# Patient Record
Sex: Male | Born: 1965 | Race: White | Hispanic: Yes | Marital: Single | Smoking: Never smoker
Health system: Southern US, Community
[De-identification: ages and names within clinical notes are randomized; demographics above are authoritative.]

## PROBLEM LIST (undated history)

## (undated) DIAGNOSIS — G4730 Sleep apnea, unspecified: Secondary | ICD-10-CM

## (undated) HISTORY — DX: Sleep apnea, unspecified: G47.30

## (undated) HISTORY — PX: CATARACT EXTRACTION: SUR2

---

## 2010-11-17 ENCOUNTER — Emergency Department (HOSPITAL_COMMUNITY)
Admission: EM | Admit: 2010-11-17 | Discharge: 2010-11-17 | Disposition: A | Discharge status: Home / Self Care | Payer: Self-pay | Attending: Emergency Medicine | Admitting: Emergency Medicine

## 2010-11-17 DIAGNOSIS — IMO0002 Reserved for concepts with insufficient information to code with codable children: Secondary | ICD-10-CM | POA: Insufficient documentation

## 2010-11-17 DIAGNOSIS — H168 Other keratitis: Secondary | ICD-10-CM | POA: Insufficient documentation

## 2010-11-17 DIAGNOSIS — T2006XA Burn of unspecified degree of forehead and cheek, initial encounter: Secondary | ICD-10-CM | POA: Insufficient documentation

## 2010-11-17 DIAGNOSIS — T2650XA Corrosion of unspecified eyelid and periocular area, initial encounter: Secondary | ICD-10-CM | POA: Insufficient documentation

## 2017-02-09 ENCOUNTER — Encounter (HOSPITAL_COMMUNITY): Payer: Self-pay

## 2017-02-09 ENCOUNTER — Emergency Department (HOSPITAL_COMMUNITY): Payer: Self-pay

## 2017-02-09 ENCOUNTER — Emergency Department (HOSPITAL_COMMUNITY)
Admission: EM | Admit: 2017-02-09 | Discharge: 2017-02-09 | Disposition: A | Discharge status: Home / Self Care | Payer: Self-pay | Attending: Emergency Medicine | Admitting: Emergency Medicine

## 2017-02-09 DIAGNOSIS — Z5321 Procedure and treatment not carried out due to patient leaving prior to being seen by health care provider: Secondary | ICD-10-CM | POA: Insufficient documentation

## 2017-02-09 DIAGNOSIS — R1011 Right upper quadrant pain: Secondary | ICD-10-CM | POA: Insufficient documentation

## 2017-02-09 DIAGNOSIS — R1010 Upper abdominal pain, unspecified: Secondary | ICD-10-CM

## 2017-02-09 DIAGNOSIS — R1031 Right lower quadrant pain: Secondary | ICD-10-CM | POA: Insufficient documentation

## 2017-02-09 LAB — COMPREHENSIVE METABOLIC PANEL
ALT: 36 U/L (ref 17–63)
AST: 21 U/L (ref 15–41)
Albumin: 3.5 g/dL (ref 3.5–5.0)
Alkaline Phosphatase: 62 U/L (ref 38–126)
Anion gap: 11 (ref 5–15)
BILIRUBIN TOTAL: 1.2 mg/dL (ref 0.3–1.2)
BUN: 14 mg/dL (ref 6–20)
CHLORIDE: 100 mmol/L — AB (ref 101–111)
CO2: 27 mmol/L (ref 22–32)
CREATININE: 0.78 mg/dL (ref 0.61–1.24)
Calcium: 9.2 mg/dL (ref 8.9–10.3)
GFR calc Af Amer: >60 mL/min (ref >60)
Glucose, Bld: 143 mg/dL — ABNORMAL HIGH (ref 65–99)
Potassium: 3.5 mmol/L (ref 3.5–5.1)
Sodium: 138 mmol/L (ref 135–145)
Total Protein: 7.4 g/dL (ref 6.5–8.1)

## 2017-02-09 LAB — URINALYSIS, ROUTINE W REFLEX MICROSCOPIC
BILIRUBIN URINE: NEGATIVE
GLUCOSE, UA: NEGATIVE mg/dL
HGB URINE DIPSTICK: NEGATIVE
KETONES UR: NEGATIVE mg/dL
LEUKOCYTES UA: NEGATIVE
Nitrite: NEGATIVE
PROTEIN: NEGATIVE mg/dL
Specific Gravity, Urine: 1.018 (ref 1.005–1.030)
pH: 5 (ref 5.0–8.0)

## 2017-02-09 LAB — CBC
HCT: 43.7 % (ref 39.0–52.0)
Hemoglobin: 14.9 g/dL (ref 13.0–17.0)
MCH: 30.8 pg (ref 26.0–34.0)
MCHC: 34.1 g/dL (ref 30.0–36.0)
MCV: 90.3 fL (ref 78.0–100.0)
PLATELETS: 248 10*3/uL (ref 150–400)
RBC: 4.84 MIL/uL (ref 4.22–5.81)
RDW: 12.5 % (ref 11.5–15.5)
WBC: 8.5 10*3/uL (ref 4.0–10.5)

## 2017-02-09 LAB — LIPASE, BLOOD: LIPASE: 31 U/L (ref 11–51)

## 2017-02-09 MED ORDER — PANTOPRAZOLE SODIUM 40 MG IV SOLR
40.0000 mg | Freq: Once | INTRAVENOUS | Status: AC
  Administered 2017-02-09: 40 mg via INTRAVENOUS
  Filled 2017-02-09: qty 40

## 2017-02-09 MED ORDER — TRAMADOL HCL 50 MG PO TABS: 50.0000 mg | ORAL_TABLET | Freq: Four times a day (QID) | ORAL | 0 refills | Status: DC | PRN

## 2017-02-09 MED ORDER — SODIUM CHLORIDE 0.9 % IV BOLUS (SEPSIS)
500.0000 mL | Freq: Once | INTRAVENOUS | Status: AC
  Administered 2017-02-09: 500 mL via INTRAVENOUS

## 2017-02-09 MED ORDER — PROMETHAZINE HCL 25 MG PO TABS: 25.0000 mg | ORAL_TABLET | Freq: Four times a day (QID) | ORAL | 0 refills | Status: DC | PRN

## 2017-02-09 MED ORDER — RANITIDINE HCL 150 MG PO TABS: 150.0000 mg | ORAL_TABLET | Freq: Two times a day (BID) | ORAL | 0 refills | Status: DC

## 2017-02-09 NOTE — Discharge Instructions (Signed)
Follow-up with the stomach doctors at [facility].   Return if problems.

## 2017-02-09 NOTE — ED Provider Notes (Signed)
[facility]EMER[facility]ENCY DEPT Provider Note   CSN: [id number] Arrival date & time: 02/09/17  7829     History   Chief Complaint Chief Complaint  Patient presents with  . Abdominal Pain    HPI Darryl Hull is a 51 y.o. male.  Patient complains of abdominal pain. He's been having periodic right upper quadrant abdominal pain. He came to the emergency room early this morning had lab work done but was not seen and went home. The pain returned.   The history is provided by the patient.  Abdominal Pain   This is a new problem. The current episode started 12 to 24 hours ago. The problem occurs constantly. The problem has not changed since onset.The pain is associated with an unknown factor. The pain is located in the RUQ. The pain is at a severity of 4/10. The pain is moderate. Pertinent negatives include anorexia, diarrhea, frequency, hematuria and headaches. Nothing aggravates the symptoms. Nothing relieves the symptoms. Past workup does not include GI consult. His past medical history does not include PUD.    History reviewed. No pertinent past medical history.  There are no active problems to display for this patient.   History reviewed. No pertinent surgical history.     Home Medications    Prior to Admission medications   Medication Sig Start Date End Date Taking? Authorizing Provider  promethazine (PHENERGAN) 25 MG tablet Take 1 tablet (25 mg total) by mouth every 6 (six) hours as needed for nausea or vomiting. 02/09/17   Bethann Berkshire, MD  ranitidine (ZANTAC) 150 MG tablet Take 1 tablet (150 mg total) by mouth 2 (two) times daily. 02/09/17   Bethann Berkshire, MD  traMADol (ULTRAM) 50 MG tablet Take 1 tablet (50 mg total) by mouth every 6 (six) hours as needed. 02/09/17   Bethann Berkshire, MD    Family History No family history on file.  Social History Social History  Substance Use Topics  . Smoking status: Never Smoker  . Smokeless tobacco: Never Used  . Alcohol use Yes   Comment: occasionally      Allergies   Patient has no known allergies.   Review of Systems Review of Systems  Constitutional: Negative for appetite change and fatigue.  HENT: Negative for congestion, ear discharge and sinus pressure.   Eyes: Negative for discharge.  Respiratory: Negative for cough.   Cardiovascular: Negative for chest pain.  Gastrointestinal: Positive for abdominal pain. Negative for anorexia and diarrhea.  Genitourinary: Negative for frequency and hematuria.  Musculoskeletal: Negative for back pain.  Skin: Negative for rash.  Neurological: Negative for seizures and headaches.  Psychiatric/Behavioral: Negative for hallucinations.     Physical Exam Updated Vital Signs BP (!) 143/92   Pulse 67   Temp 97.8 F (36.6 C) (Oral)   Resp 18   Ht 6\' 2"  (1.88 m)   Wt 103 kg (227 lb)   SpO2 99%   BMI 29.15 kg/m   Physical Exam  Constitutional: He is oriented to person, place, and time. He appears well-developed.  HENT:  Head: Normocephalic.  Eyes: Conjunctivae and EOM are normal. No scleral icterus.  Neck: Neck supple. No thyromegaly present.  Cardiovascular: Normal rate and regular rhythm.  Exam reveals no gallop and no friction rub.   No murmur heard. Pulmonary/Chest: No stridor. He has no wheezes. He has no rales. He exhibits no tenderness.  Abdominal: He exhibits no distension. There is tenderness. There is no rebound.  Moderate right upper quadrant tenderness  Musculoskeletal: Normal range  of motion. He exhibits no edema.  Lymphadenopathy:    He has no cervical adenopathy.  Neurological: He is oriented to person, place, and time. He exhibits normal muscle tone. Coordination normal.  Skin: No rash noted. No erythema.  Psychiatric: He has a normal mood and affect. His behavior is normal.     ED Treatments / Results  Labs (all labs ordered are listed, but only abnormal results are displayed) Labs Reviewed - No data to display  EKG  EKG  Interpretation None       Radiology [place] Abdomen Complete  Result Date: 02/09/2017 CLINICAL DATA:  Right upper quadrant abdominal pain since 2 o'clock this morning. EXAM: ABDOMEN ULTRASOUND COMPLETE COMPARISON:  None. FINDINGS: Gallbladder: Suspected sludge within otherwise normal-appearing gallbladder (representative image 30). No echogenic gallbladder stones. No gallbladder wall thickening or pericholecystic fluid. Negative sonographic Murphy's sign. Common bile duct: Diameter: Normal in size measuring 5.8 mm Liver: Degraded secondary to overlying bowel gas. Imaged portions of the liver are normal. No discrete hepatic lesions. No intrahepatic biliary ductal dilatation. No ascites. IVC: No abnormality visualized. Pancreas: Limited visualization of the pancreatic head and neck is normal. Visualization of the pancreatic body and tail is obscured by bowel gas. Spleen: Normal in size measuring 6.8 cm in length Right Kidney: Normal cortical thickness, echogenicity and size, measuring 10.8 cm in length. No focal renal lesions. No echogenic renal stones. No urinary obstruction. Left Kidney: Normal cortical thickness, echogenicity and size, measuring 12.1 cm in length. No focal renal lesions. No echogenic renal stones. No urinary obstruction. Abdominal aorta: No aneurysm visualized. Other findings: None. IMPRESSION: 1. Suspected sludge within otherwise normal-appearing gallbladder. If clinical concern persists for acute cholecystitis, further evaluation nuclear medicine HIDA scan could performed as indicated. 2. Otherwise, no explanation for patient's right upper quadrant abdominal pain. Electronically Signed   By: Simonne ComeJohn  Watts M.D.   On: 02/09/2017 12:19    Procedures Procedures (including critical care time)  Medications Ordered in ED Medications  pantoprazole (PROTONIX) injection 40 mg (40 mg Intravenous Given 02/09/17 1022)  sodium chloride 0.9 % bolus 500 mL (0 mLs Intravenous Stopped 02/09/17 1102)      Initial Impression / Assessment and Plan / ED Course  I have reviewed the triage vital signs and the nursing notes.  Pertinent labs & imaging results that were available during my care of the patient were reviewed by me and considered in my medical decision making (see chart for details).       Final Clinical Impressions(s) / ED Diagnoses   Final diagnoses:  Pain of upper abdomen    New Prescriptions New Prescriptions   PROMETHAZINE (PHENERGAN) 25 MG TABLET    Take 1 tablet (25 mg total) by mouth every 6 (six) hours as needed for nausea or vomiting.   RANITIDINE (ZANTAC) 150 MG TABLET    Take 1 tablet (150 mg total) by mouth 2 (two) times daily.   TRAMADOL (ULTRAM) 50 MG TABLET    Take 1 tablet (50 mg total) by mouth every 6 (six) hours as needed.     Bethann BerkshireZammit, Yalena Colon, MD 02/09/17 1345

## 2017-02-09 NOTE — ED Triage Notes (Signed)
Pt complaining of lower R abdominal pain x 3 days. Pt deneis any n/V/D. Pt denies any urinary symptoms.

## 2017-02-09 NOTE — ED Triage Notes (Signed)
Per Pt and family, Pt is complaining of RUQ pain that started at 0200 this morning. Pt has had pain last thursday, but it returned. Pt is tearful at triage. Denies any N/V/D.

## 2017-02-09 NOTE — ED Notes (Signed)
At desk refusing to stay.  States my pain and nausea is gone and I want to leave.  Encouraged to stay since he is second on list to go back.   Refused and left at this time.

## 2017-02-09 NOTE — ED Notes (Signed)
Patient left at this time with all belongings. 

## 2017-02-10 ENCOUNTER — Encounter: Payer: Self-pay | Admitting: Physician Assistant

## 2017-02-17 ENCOUNTER — Ambulatory Visit (INDEPENDENT_AMBULATORY_CARE_PROVIDER_SITE_OTHER): Payer: BLUE CROSS/BLUE SHIELD | Admitting: Physician Assistant

## 2017-02-17 ENCOUNTER — Encounter: Payer: Self-pay | Admitting: Physician Assistant

## 2017-02-17 VITALS — BP 102/70 | HR 86 | Ht 74.0 in | Wt 217.0 lb

## 2017-02-17 DIAGNOSIS — Z800 Family history of malignant neoplasm of digestive organs: Secondary | ICD-10-CM

## 2017-02-17 DIAGNOSIS — R1011 Right upper quadrant pain: Secondary | ICD-10-CM | POA: Diagnosis not present

## 2017-02-17 DIAGNOSIS — R1013 Epigastric pain: Secondary | ICD-10-CM | POA: Diagnosis not present

## 2017-02-17 DIAGNOSIS — K219 Gastro-esophageal reflux disease without esophagitis: Secondary | ICD-10-CM | POA: Diagnosis not present

## 2017-02-17 MED ORDER — NA SULFATE-K SULFATE-MG SULF 17.5-3.13-1.6 GM/177ML PO SOLN: 1.0000 | Freq: Once | ORAL | 0 refills | Status: AC

## 2017-02-17 MED ORDER — RANITIDINE HCL 150 MG PO TABS
150.0000 mg | ORAL_TABLET | Freq: Two times a day (BID) | ORAL | 2 refills | Status: DC
Start: 2017-02-17 — End: 2023-05-21

## 2017-02-17 NOTE — Patient Instructions (Addendum)
You have been scheduled for a colonoscopy. Please follow written instructions given to you at your visit today.  Please pick up your prep supplies at the pharmacy within the next 1-3 days. [facility] [address], If you use inhalers (even only as needed), please bring them with you on the day of your procedure. Your physician has requested that you go to www.startemmi.com and enter the access code given to you at your visit today. This web site gives a general overview about your procedure. However, you should still follow specific instructions given to you by our office regarding your preparation for the procedure.

## 2017-02-17 NOTE — Progress Notes (Signed)
Subjective:    Patient ID: Darryl Hull, male    DOB: [birth year], 51 y.o.   MRN: [id number]  HPI Darryl Hull is a pleasant 51 year old Hispanic male referred by Dr.Zammit/[facility] for evaluation of upper abdominal pain. Patient is non-English-speaking but accompanied by his wife who speaks [place] well. He had ER visit on 02/09/2017, and patient's wife states that he had pain which was fairly constant for about 3 days prior to that ER visit. He says his pain was located in the epigastrium and right upper quadrant without radiation, and constant. He did not have any associated nausea or vomiting, no fever or chills. No diarrhea melena or hematochezia. He does do a lot of physical activity at work but does not feel that he injured himself or pulled a muscle. He does feel better after starting on Zantac 150 mg by mouth twice daily. He says the pain for the most part has resolved. His appetite has been good and his weight has been stable. He apparently does have intermittent heartburn and indigestion but denies any dysphagia. Exline He denies any regular aspirin or NSAID use. No prior GI history with the exception of a remote colonoscopy perhaps 10 years ago done in [place] is negative. Evaluation in the emergency room on 02/09/2017 labs were unremarkable including CBC, CMET,and lipase with the exception of glucose at 143. Upper abdominal ultrasound showed suspected gallbladder sludge no stones no wall thickening and no ductal dilation visualized pancreas unremarkable. Family history is positive for colon cancer in his father diagnosed at age 22, now deceased  Review of Systems Pertinent positive and negative review of systems were noted in the above HPI section.  All other review of systems was otherwise negative.  Outpatient Encounter Prescriptions as of 02/17/2017  Medication Sig  . Na Sulfate-K Sulfate-Mg Sulf 17.5-3.13-1.6 GM/180ML SOLN Take 1 kit by mouth once.  . promethazine  (PHENERGAN) 25 MG tablet Take 1 tablet (25 mg total) by mouth every 6 (six) hours as needed for nausea or vomiting.  . ranitidine (ZANTAC) 150 MG tablet Take 1 tablet (150 mg total) by mouth 2 (two) times daily.  . traMADol (ULTRAM) 50 MG tablet Take 1 tablet (50 mg total) by mouth every 6 (six) hours as needed.  . [DISCONTINUED] ranitidine (ZANTAC) 150 MG tablet Take 1 tablet (150 mg total) by mouth 2 (two) times daily.   No facility-administered encounter medications on file as of 02/17/2017.    No Known Allergies There are no active problems to display for this patient.  Social History   Social History  . Marital status: Married    Spouse name: N/A  . Number of children: N/A  . Years of education: N/A   Occupational History  . Not on file.   Social History Main Topics  . Smoking status: Never Smoker  . Smokeless tobacco: Never Used  . Alcohol use Yes     Comment: occasionally   . Drug use: No  . Sexual activity: Not on file   Other Topics Concern  . Not on file   Social History Narrative  . No narrative on file    Darryl Hull's family history is not on file.      Objective:    Vitals:   02/17/17 1405  BP: 102/70  Pulse: 86    Physical Exam  well-developed Hispanic male in no acute distress, accompanied by his wife who does interpretation. Blood pressure 102/70, pulse 86 height 6 foot 2, weight  217, BMI 27.8. HEENT; nontraumatic, cephalic EOMI PERRLA sclera anicteric, Cardiovascular; regular rate and rhythm with S1-S2 no murmur rub or gallop, Pulmonary; clear bilaterally, Abdomen ;soft, nontender nondistended bowel sounds are active there is no palpable mass or hepatosplenomegaly, Rectal; exam not done, Extremities ;no clubbing cyanosis or edema skin warm and dry, Neuropsych; mood and affect appropriate       Assessment & Plan:   #90  51 year old Hispanic male, non-English speaking with recent ER visit for upper abdominal pain, now resolved on ranitidine 150 mg  by mouth twice a day. Suspect acute gastritis or peptic ulcer disease, cannot rule out H. pylori. #2 possible gallbladder sludge on ultrasound no stones #3 intermittent GERD #4 positive family history of colon cancer in the patient's father diagnosed in his late 66s.  Plan; Continue Ranitidine 150 mg by mouth twice a day, new rx sent Avoid aspirin and NSAIDs Patient will be scheduled for upper endoscopy and colonoscopy with Dr. Fuller Plan. Procedure discussed in detail with patient and his wife including risks and benefits and he is agreeable to proceed.  Prisilla Kocsis S Rush Salce PA-C 02/17/2017   Cc: No ref. provider found

## 2017-02-17 NOTE — Progress Notes (Signed)
Reviewed and agree with management plan.  Nailea Whitehorn T. Fawzi Melman, MD FACG 

## 2017-05-01 ENCOUNTER — Ambulatory Visit (AMBULATORY_SURGERY_CENTER): Payer: BLUE CROSS/BLUE SHIELD | Admitting: Gastroenterology

## 2017-05-01 ENCOUNTER — Encounter: Payer: Self-pay | Admitting: Gastroenterology

## 2017-05-01 VITALS — BP 119/83 | HR 75 | Temp 98.2°F | Resp 13 | Ht 74.0 in | Wt 217.0 lb

## 2017-05-01 DIAGNOSIS — Z800 Family history of malignant neoplasm of digestive organs: Secondary | ICD-10-CM | POA: Diagnosis present

## 2017-05-01 DIAGNOSIS — K2950 Unspecified chronic gastritis without bleeding: Secondary | ICD-10-CM | POA: Diagnosis not present

## 2017-05-01 DIAGNOSIS — R1013 Epigastric pain: Secondary | ICD-10-CM

## 2017-05-01 DIAGNOSIS — B9681 Helicobacter pylori [H. pylori] as the cause of diseases classified elsewhere: Secondary | ICD-10-CM | POA: Diagnosis not present

## 2017-05-01 DIAGNOSIS — Z1211 Encounter for screening for malignant neoplasm of colon: Secondary | ICD-10-CM

## 2017-05-01 DIAGNOSIS — Z1212 Encounter for screening for malignant neoplasm of rectum: Secondary | ICD-10-CM | POA: Diagnosis not present

## 2017-05-01 DIAGNOSIS — K2970 Gastritis, unspecified, without bleeding: Secondary | ICD-10-CM

## 2017-05-01 DIAGNOSIS — K2990 Gastroduodenitis, unspecified, without bleeding: Secondary | ICD-10-CM | POA: Diagnosis not present

## 2017-05-01 MED ORDER — OMEPRAZOLE 40 MG PO CPDR: 40.0000 mg | DELAYED_RELEASE_CAPSULE | Freq: Every day | ORAL | 1 refills | Status: DC

## 2017-05-01 NOTE — Progress Notes (Signed)
Interpreter used today at the [facility] for this pt.  Interpreter's name is-Maria the patients wife.

## 2017-05-01 NOTE — Patient Instructions (Addendum)
YOU HAD AN ENDOSCOPIC PROCEDURE TODAY AT THE [facility]:   Refer to the procedure report that was given to you for any specific questions about what was found during the examination.  If the procedure report does not answer your questions, please call your gastroenterologist to clarify.  If you requested that your care partner not be given the details of your procedure findings, then the procedure report has been included in a sealed envelope for you to review at your convenience later.  YOU SHOULD EXPECT: Some feelings of bloating in the abdomen. Passage of more gas than usual.  Walking can help get rid of the air that was put into your GI tract during the procedure and reduce the bloating. If you had a lower endoscopy (such as a colonoscopy or flexible sigmoidoscopy) you may notice spotting of blood in your stool or on the toilet paper. If you underwent a bowel prep for your procedure, you may not have a normal bowel movement for a few days.  Please Note:  You might notice some irritation and congestion in your nose or some drainage.  This is from the oxygen used during your procedure.  There is no need for concern and it should clear up in a day or so.  SYMPTOMS TO REPORT IMMEDIATELY:   Following lower endoscopy (colonoscopy or flexible sigmoidoscopy):  Excessive amounts of blood in the stool  Significant tenderness or worsening of abdominal pains  Swelling of the abdomen that is new, acute  Fever of 100F or higher   Following upper endoscopy (EGD)  Vomiting of blood or coffee ground material  New chest pain or pain under the shoulder blades  Painful or persistently difficult swallowing  New shortness of breath  Fever of 100F or higher  Black, tarry-looking stools  For urgent or emergent issues, a gastroenterologist can be reached at any hour by calling [phone].  Start omeprazole 40 mg daily by mouth for 2 months. No ASA, Ibuprofen, Aleve, motrin or NSAID  drugs.   DIET:  We do recommend a small meal at first, but then you may proceed to your regular diet.  Drink plenty of fluids but you should avoid alcoholic beverages for 24 hours.  Please read all handouts given to you by your recovery nurse.  ACTIVITY:  You should plan to take it easy for the rest of today and you should NOT DRIVE or use heavy machinery until tomorrow (because of the sedation medicines used during the test).    FOLLOW UP: Our staff will call the number listed on your records the next business day following your procedure to check on you and address any questions or concerns that you may have regarding the information given to you following your procedure. If we do not reach you, we will leave a message.  However, if you are feeling well and you are not experiencing any problems, there is no need to return our call.  We will assume that you have returned to your regular daily activities without incident.  If any biopsies were taken you will be contacted by phone or by letter within the next 1-3 weeks.  Please call us at [phone] if you have not heard about the biopsies in 3 weeks.    SIGNATURES/CONFIDENTIALITY: You and/or your care partner have signed paperwork which will be entered into your electronic medical record.  These signatures attest to the fact that that the information above on your After Visit Summary has been reviewed  and is understood.  Full responsibility of the confidentiality of this discharge information lies with you and/or your care-partner.  Thank you for letting us take care of your healthcare needs today.

## 2017-05-01 NOTE — Progress Notes (Signed)
Called to room to assist during endoscopic procedure.  Patient ID and intended procedure confirmed with present staff. Received instructions for my participation in the procedure from the performing physician.  

## 2017-05-01 NOTE — Op Note (Signed)
[facility] Patient Name: Darryl Hull Procedure Date: 05/01/2017 8:22 AM MRN: [id number] Endoscopist: Meryl Dare , MD Age: 51 Referring MD:  Date of Birth: Jan 30, 1966 Gender: Male Account #: [id number] Procedure:                Upper GI endoscopy Indications:              Epigastric abdominal pain Medicines:                Monitored Anesthesia Care Procedure:                Pre-Anesthesia Assessment:                           - Prior to the procedure, a History and Physical                            was performed, and patient medications and                            allergies were reviewed. The patient's tolerance of                            previous anesthesia was also reviewed. The risks                            and benefits of the procedure and the sedation                            options and risks were discussed with the patient.                            All questions were answered, and informed consent                            was obtained. Prior Anticoagulants: The patient has                            taken no previous anticoagulant or antiplatelet                            agents. ASA Grade Assessment: II - A patient with                            mild systemic disease. After reviewing the risks                            and benefits, the patient was deemed in                            satisfactory condition to undergo the procedure.                           After obtaining informed consent, the endoscope was  passed under direct vision. Throughout the                            procedure, the patient's blood pressure, pulse, and                            oxygen saturations were monitored continuously. The                            Model GIF-[id number]) scope was introduced                            through the mouth, and advanced to the second part                            of duodenum. The upper GI  endoscopy was                            accomplished without difficulty. The patient                            tolerated the procedure well. Scope In: Scope Out: Findings:                 The examined esophagus was normal.                           One non-bleeding cratered gastric ulcer with no                            stigmata of bleeding was found in the gastric                            antrum. The lesion was 6 mm in largest dimension.                           Diffuse moderate inflammation characterized by                            erythema, friability and granularity was found in                            the gastric fundus and in the gastric body.                            Biopsies were taken with a cold forceps for                            histology.                           A few localized, small non-bleeding erosions were                            found in the gastric antrum. There were  no stigmata                            of recent bleeding. Biopsies were taken with a cold                            forceps for histology.                           The exam of the stomach was otherwise normal.                           The duodenal bulb and second portion of the                            duodenum were normal. Complications:            No immediate complications. Estimated Blood Loss:     Estimated blood loss was minimal. Impression:               - Normal esophagus.                           - Non-bleeding gastric ulcer with no stigmata of                            bleeding.                           - Gastritis. Biopsied.                           - Non-bleeding erosive gastropathy. Biopsied.                           - Normal duodenal bulb and second portion of the                            duodenum. Recommendation:           - Patient has a contact number available for                            emergencies. The signs and symptoms of potential                             delayed complications were discussed with the                            patient. Return to normal activities tomorrow.                            Written discharge instructions were provided to the                            patient.                           -  Resume previous diet.                           - Continue present medications.                           - Await pathology results.                           - Prilosec (omeprazole) 40 mg PO daily for 2 months.                           - Return to GI office in 2 months.                           - No aspirin, ibuprofen, naproxen, or other                            non-steroidal anti-inflammatory drugs. Meryl Dare, MD 05/01/2017 9:02:41 AM This report has been signed electronically.

## 2017-05-01 NOTE — Progress Notes (Signed)
Report to PACU, RN, vss, BBS= Clear.  

## 2017-05-01 NOTE — Op Note (Signed)
[facility] Patient Name: Darryl Hull Procedure Date: 05/01/2017 8:23 AM MRN: [id number] Endoscopist: Meryl Dare , MD Age: 51 Referring MD:  Date of Birth: 1966-08-27 Gender: Male Account #: [id number] Procedure:                Colonoscopy Indications:              Screening in patient at increased risk: Family                            history of 1st-degree relative with colorectal                            cancer before age 73 years Medicines:                Monitored Anesthesia Care Procedure:                Pre-Anesthesia Assessment:                           - Prior to the procedure, a History and Physical                            was performed, and patient medications and                            allergies were reviewed. The patient's tolerance of                            previous anesthesia was also reviewed. The risks                            and benefits of the procedure and the sedation                            options and risks were discussed with the patient.                            All questions were answered, and informed consent                            was obtained. Prior Anticoagulants: The patient has                            taken no previous anticoagulant or antiplatelet                            agents. ASA Grade Assessment: II - A patient with                            mild systemic disease. After reviewing the risks                            and benefits, the patient was deemed in  satisfactory condition to undergo the procedure.                           After obtaining informed consent, the colonoscope                            was passed under direct vision. Throughout the                            procedure, the patient's blood pressure, pulse, and                            oxygen saturations were monitored continuously. The                            Model PCF-[id number]) scope was  introduced                            through the anus and advanced to the the cecum,                            identified by appendiceal orifice and ileocecal                            valve. The ileocecal valve, appendiceal orifice,                            and rectum were photographed. The quality of the                            bowel preparation was excellent. The colonoscopy                            was performed without difficulty. The patient                            tolerated the procedure well. Scope In: 8:31:20 AM Scope Out: 8:43:32 AM Scope Withdrawal Time: 0 hours 10 minutes 55 seconds  Total Procedure Duration: 0 hours 12 minutes 12 seconds  Findings:                 The perianal and digital rectal examinations were                            normal.                           Internal hemorrhoids were found during                            retroflexion. The hemorrhoids were small and Grade                            I (internal hemorrhoids that do not prolapse).  The exam was otherwise without abnormality on                            direct and retroflexion views. Complications:            No immediate complications. Estimated blood loss:                            None. Estimated Blood Loss:     Estimated blood loss: none. Impression:               - Internal hemorrhoids.                           - The examination was otherwise normal on direct                            and retroflexion views.                           - No specimens collected. Recommendation:           - Repeat colonoscopy in 5 years for screening                            purposes.                           - Patient has a contact number available for                            emergencies. The signs and symptoms of potential                            delayed complications were discussed with the                            patient. Return to normal activities  tomorrow.                            Written discharge instructions were provided to the                            patient.                           - Resume previous diet.                           - Continue present medications. Meryl Dare, MD 05/01/2017 8:54:04 AM This report has been signed electronically.

## 2017-05-01 NOTE — Progress Notes (Signed)
Pt's states no medical or surgical changes since previsit or office visit. 

## 2017-05-02 ENCOUNTER — Telehealth: Payer: Self-pay | Admitting: *Deleted

## 2017-05-02 NOTE — Telephone Encounter (Signed)
  Follow up Call-  Call back number 05/01/2017  Post procedure Call Back phone  # [phone]  Permission to leave phone message Yes     No answer, left message

## 2017-05-02 NOTE — Telephone Encounter (Signed)
  Follow up Call-  Call back number 05/01/2017  Post procedure Call Back phone  # [phone]  Permission to leave phone message Yes     No answer, left message 

## 2017-05-09 ENCOUNTER — Other Ambulatory Visit: Payer: Self-pay

## 2017-05-09 MED ORDER — BIS SUBCIT-METRONID-TETRACYC 140-125-125 MG PO CAPS: 3.0000 | ORAL_CAPSULE | Freq: Three times a day (TID) | ORAL | 0 refills | Status: DC

## 2017-07-07 ENCOUNTER — Encounter: Payer: Self-pay | Admitting: Orthopedic Surgery

## 2017-07-07 ENCOUNTER — Emergency Department (HOSPITAL_COMMUNITY): Payer: BLUE CROSS/BLUE SHIELD

## 2017-07-07 ENCOUNTER — Encounter (HOSPITAL_COMMUNITY): Payer: Self-pay

## 2017-07-07 ENCOUNTER — Emergency Department (HOSPITAL_COMMUNITY)
Admission: EM | Admit: 2017-07-07 | Discharge: 2017-07-07 | Disposition: A | Discharge status: Home / Self Care | Payer: BLUE CROSS/BLUE SHIELD | Attending: Emergency Medicine | Admitting: Emergency Medicine

## 2017-07-07 DIAGNOSIS — W010XXA Fall on same level from slipping, tripping and stumbling without subsequent striking against object, initial encounter: Secondary | ICD-10-CM | POA: Insufficient documentation

## 2017-07-07 DIAGNOSIS — Y998 Other external cause status: Secondary | ICD-10-CM | POA: Insufficient documentation

## 2017-07-07 DIAGNOSIS — Z79899 Other long term (current) drug therapy: Secondary | ICD-10-CM | POA: Insufficient documentation

## 2017-07-07 DIAGNOSIS — Y9341 Activity, dancing: Secondary | ICD-10-CM | POA: Diagnosis not present

## 2017-07-07 DIAGNOSIS — S6991XA Unspecified injury of right wrist, hand and finger(s), initial encounter: Secondary | ICD-10-CM | POA: Insufficient documentation

## 2017-07-07 DIAGNOSIS — Y929 Unspecified place or not applicable: Secondary | ICD-10-CM | POA: Insufficient documentation

## 2017-07-07 MED ORDER — NAPROXEN 500 MG PO TABS: 500.0000 mg | ORAL_TABLET | Freq: Two times a day (BID) | ORAL | 0 refills | Status: DC

## 2017-07-07 MED ORDER — OXYCODONE-ACETAMINOPHEN 5-325 MG PO TABS
1.0000 | ORAL_TABLET | ORAL | Status: DC | PRN
  Administered 2017-07-07: 1 via ORAL

## 2017-07-07 MED ORDER — OXYCODONE-ACETAMINOPHEN 5-325 MG PO TABS
ORAL_TABLET | ORAL | Status: AC
  Filled 2017-07-07: qty 1

## 2017-07-07 NOTE — Discharge Instructions (Signed)
You have been seen today for a finger injury. There were no acute abnormalities on the x-rays, including no sign of fracture or dislocation. Pain: Take 600 mg of ibuprofen every 6 hours or 440 mg (over the counter dose) to 500 mg (prescription dose) of naproxen every 12 hours for the next 3 days. After this time, these medications may be used as needed for pain. Take these medications with food to avoid upset stomach. Choose only one of these medications, do not take them together.  Tylenol: Should you continue to have additional pain while taking the ibuprofen or naproxen, you may add in tylenol as needed. Your daily total maximum amount of tylenol from all sources should be limited to 4000mg /day for persons without liver problems, or 2000mg /day for those with liver problems. Ice: May apply ice to the area over the next 24 hours for 15 minutes at a time to reduce swelling. Elevation: Keep the extremity elevated as often as possible to reduce pain and inflammation. Support: Keep splint intact and dry Follow up: Dr. Carollee Massedhompson's office will call you tomorrow to set up appointment.

## 2017-07-07 NOTE — ED Triage Notes (Signed)
Per Pt, Pt was dancing last night when he fell and hurt his right fourth finger. Swelling and bruising noted to finger with reports of painful movement

## 2017-07-07 NOTE — ED Provider Notes (Signed)
[facility] EMERGENCY DEPARTMENT Provider Note   CSN: [id number] Arrival date & time: 07/07/17  4098     History   Chief Complaint Chief Complaint  Patient presents with  . Finger Injury    HPI Darryl Hull is a 51 y.o. male.  The history is provided by the spouse and the patient. Language interpreter used: Spanish.     Darryl Hull is a 51 y.o. male, patient with no pertinent past medical history, presenting to the ED with right ring finger injury that occurred last night. Patient states he fell while dancing. Reports the distal portion was ulnarly angulated at the PIP, but patient reduced it himself with some relief in pain. Current pain is 7/10, throbbing, nonradiating. Has taken tylenol. Denies numbness/tingling, head injury, neck/back pain, wrist pain, other finger pain, or any other complaints.   History reviewed. No pertinent past medical history.  There are no active problems to display for this patient.   History reviewed. No pertinent surgical history.     Home Medications    Prior to Admission medications   Medication Sig Start Date End Date Taking? Authorizing Provider  bismuth-metronidazole-tetracycline [facility]) [phone] MG capsule Take 3 capsules by mouth 4 (four) times daily -  before meals and at bedtime. 05/09/17   Meryl Dare, MD  naproxen (NAPROSYN) 500 MG tablet Take 1 tablet (500 mg total) by mouth 2 (two) times daily. 07/07/17   Jonell Brumbaugh C, PA-C  omeprazole (PRILOSEC) 40 MG capsule Take 1 capsule (40 mg total) by mouth daily. 05/01/17   Meryl Dare, MD  promethazine (PHENERGAN) 25 MG tablet Take 1 tablet (25 mg total) by mouth every 6 (six) hours as needed for nausea or vomiting. 02/09/17   Bethann Berkshire, MD  ranitidine (ZANTAC) 150 MG tablet Take 1 tablet (150 mg total) by mouth 2 (two) times daily. 02/17/17   Esterwood, Amy S, PA-C  traMADol (ULTRAM) 50 MG tablet Take 1 tablet (50 mg total) by mouth every 6 (six)  hours as needed. 02/09/17   Bethann Berkshire, MD    Family History No family history on file.  Social History Social History  Substance Use Topics  . Smoking status: Never Smoker  . Smokeless tobacco: Never Used  . Alcohol use Yes     Comment: occasionally      Allergies   Patient has no known allergies.   Review of Systems Review of Systems  Musculoskeletal: Positive for arthralgias and joint swelling.  Neurological: Negative for weakness and numbness.     Physical Exam Updated Vital Signs BP (!) 133/93 (BP Location: Left Arm)   Pulse 82   Temp 98.2 F (36.8 C) (Oral)   Resp 16   Ht 6\' 2"  (1.88 m)   Wt 100.7 kg (222 lb)   SpO2 99%   BMI 28.50 kg/m   Physical Exam  Constitutional: He appears well-developed and well-nourished. No distress.  HENT:  Head: Normocephalic and atraumatic.  Eyes: Conjunctivae are normal.  Neck: Neck supple.  Cardiovascular: Normal rate, regular rhythm and intact distal pulses.   Pulmonary/Chest: Effort normal.  Musculoskeletal: He exhibits edema and tenderness. He exhibits no deformity.  Tenderness and swelling to the right ring finger that appears to be centered on the PIP joint. No noted laxity or deformity. Limited ROM at PIP joint, but flexion and extension ability seems to be intact and limited by swelling.  Full ROM in the rest of the fingers, the right hand, and the right wrist.  Neurological: He is alert.  No noted sensory deficits.   Skin: Skin is warm and dry. Capillary refill takes less than 2 seconds. He is not diaphoretic. No pallor.  Psychiatric: He has a normal mood and affect. His behavior is normal.  Nursing note and vitals reviewed.    ED Treatments / Results  Labs (all labs ordered are listed, but only abnormal results are displayed) Labs Reviewed - No data to display  EKG  EKG Interpretation None       Radiology Dg Hand Complete Right  Result Date: 07/07/2017 CLINICAL DATA:  Right hand pain and  swelling after fall last night. EXAM: RIGHT HAND - COMPLETE 3+ VIEW COMPARISON:  None. FINDINGS: There is no evidence of fracture or dislocation. There is no evidence of arthropathy or other focal bone abnormality. Soft tissues are unremarkable. IMPRESSION: Normal right hand. Electronically Signed   By: Lupita Raider, M.D.   On: 07/07/2017 09:49    Procedures .Nerve Block Date/Time: 07/07/2017 10:10 AM Performed by: Anselm Pancoast Authorized by: Anselm Pancoast   Consent:    Consent obtained:  Verbal   Consent given by:  Patient   Risks discussed:  Unsuccessful block, swelling, pain and infection Indications:    Indications:  Pain relief Location:    Body area:  Upper extremity   Upper extremity nerve blocked: Digital.   Laterality:  Right Pre-procedure details:    Skin preparation:  Alcohol Procedure details (see MAR for exact dosages):    Block needle gauge:  25 G   Anesthetic injected:  Bupivacaine 0.5% w/o epi Post-procedure details:    Outcome:  Pain relieved   Patient tolerance of procedure:  Tolerated well, no immediate complications .Splint Application Date/Time: 07/07/2017 10:22 AM Performed by: Anselm Pancoast Authorized by: Anselm Pancoast   Consent:    Consent obtained:  Verbal   Consent given by:  Patient   Risks discussed:  Discoloration, numbness, pain and swelling Pre-procedure details:    Sensation:  Normal   Skin color:  Normal Procedure details:    Laterality:  Right   Location:  Finger   Finger:  R ring finger   Splint type:  Finger   Supplies:  Aluminum splint Post-procedure details:    Pain:  Unchanged   Sensation:  Normal   Skin color:  Normal   Patient tolerance of procedure:  Tolerated well, no immediate complications Comments:     Procedure was performed by the Med Tech with my evaluation before and after. I was available for consultation throughout the procedure.   (including critical care time)  Medications Ordered in ED Medications    oxyCODONE-acetaminophen (PERCOCET/ROXICET) 5-325 MG per tablet 1 tablet (1 tablet Oral Given 07/07/17 0915)  oxyCODONE-acetaminophen (PERCOCET/ROXICET) 5-325 MG per tablet (not administered)     Initial Impression / Assessment and Plan / ED Course  I have reviewed the triage vital signs and the nursing notes.  Pertinent labs & imaging results that were available during my care of the patient were reviewed by me and considered in my medical decision making (see chart for details).  Clinical Course as of Jul 08 1139  Mon Jul 07, 2017  1025 Called the number listed for Dr. Janee Morn, listed hand surgeon on call.  Reached his office.  Was told Dr. Carollee Massed coverage does not start until this evening and was asked to contact the ortho PA, Charma Igo.  [SJ]  1035 Spoke with Charma Igo, PA covering hand surgery call.  States he will come see the patient.   [SJ]  1139 Charma IgoMichael Jeffery evaluated the patient, spoke with Dr. Janee Mornhompson, and states he can follow up with Dr. Janee Mornhompson in the office.   [SJ]    Clinical Course User Index [SJ] Freeda Spivey C, PA-C    Patient presents with a right ring finger injury. Probable dislocation, but reduced by patient prior to arrival. No noted neurovascular deficits.  Digital block for comfort. Hand specialist follow up likely indicated due to questionable difficulty with extension at the PIP joint and concern for possible extensor tendon disruption. The patient was given instructions for home care as well as return precautions. Patient voices understanding of these instructions, accepts the plan, and is comfortable with discharge.      Final Clinical Impressions(s) / ED Diagnoses   Final diagnoses:  Injury of finger of right hand, initial encounter    New Prescriptions New Prescriptions   NAPROXEN (NAPROSYN) 500 MG TABLET    Take 1 tablet (500 mg total) by mouth 2 (two) times daily.     Anselm PancoastJoy, Aedon Deason C, PA-C 07/07/17 1141    Marily MemosMesner, Jason,  MD 07/07/17 1610

## 2017-07-07 NOTE — Consult Note (Signed)
Reason for Consult:Finger dislocation Referring Physician: J Mesner  Darryl Hull is an 51 y.o. male.  HPI: Darryl Hull was dancing last night and fell, dislocating the PIP joint of his right ring finger. He relocated it himself. It was still painful and swollen today and he came to the ED for evaluation. It seems to have dislocated laterally to the ulnar side. He denies N/T. He is RHD.  History reviewed. No pertinent past medical history.  History reviewed. No pertinent surgical history.  No family history on file.  Social History:  reports that he has never smoked. He has never used smokeless tobacco. He reports that he drinks alcohol. He reports that he does not use drugs.  Allergies: No Known Allergies  Medications: I have reviewed the patient's current medications.  No results found for this or any previous visit (from the past 48 hour(s)).  Dg Hand Complete Right  Result Date: 07/07/2017 CLINICAL DATA:  Right hand pain and swelling after fall last night. EXAM: RIGHT HAND - COMPLETE 3+ VIEW COMPARISON:  None. FINDINGS: There is no evidence of fracture or dislocation. There is no evidence of arthropathy or other focal bone abnormality. Soft tissues are unremarkable. IMPRESSION: Normal right hand. Electronically Signed   By: Lupita RaiderJames  Green Hull, M.D.   On: 07/07/2017 09:49    Review of Systems  Constitutional: Negative for weight loss.  HENT: Negative for ear discharge, ear pain, hearing loss and tinnitus.   Eyes: Negative for blurred vision, double vision, photophobia and pain.  Respiratory: Negative for cough, sputum production and shortness of breath.   Cardiovascular: Negative for chest pain.  Gastrointestinal: Negative for abdominal pain, nausea and vomiting.  Genitourinary: Negative for dysuria, flank pain, frequency and urgency.  Musculoskeletal: Positive for joint pain (Right ring finger). Negative for back pain, falls, myalgias and neck pain.  Neurological: Negative for  dizziness, tingling, sensory change, focal weakness, loss of consciousness and headaches.  Endo/Heme/Allergies: Does not bruise/bleed easily.  Psychiatric/Behavioral: Negative for depression, memory loss and substance abuse. The patient is not nervous/anxious.    Blood pressure (!) 133/93, pulse 82, temperature 98.2 F (36.8 C), temperature source Oral, resp. rate 16, height 6\' 2"  (1.88 m), weight 100.7 kg (222 lb), SpO2 99 %. Physical Exam  Constitutional: He appears well-developed and well-nourished. No distress.  HENT:  Head: Normocephalic.  Eyes: Conjunctivae are normal. Right eye exhibits no discharge. Left eye exhibits no discharge. No scleral icterus.  Neck: Normal range of motion.  Cardiovascular: Normal rate and regular rhythm.   Respiratory: Effort normal. No respiratory distress.  Musculoskeletal:  Right shoulder, elbow, wrist, digits- no skin wounds, TTP, swelling PIP, no instability, distal phalanx weak in extension, small proximal subungual hematoma  Sens  Ax/R/M/U intact  Mot   Ax/ R/ PIN/ M/ AIN/ U intact  Rad 2+  Neurological: He is alert.  Skin: Skin is warm and dry. He is not diaphoretic.  Psychiatric: He has a normal mood and affect. His behavior is normal.    Assessment/Plan: Right ring finger PIP dislocation -- Will splint in extension and have OP f/u with Dr. Janee Hull this week.     Darryl CaldronMichael J. Jeffery, PA-C Orthopedic Surgery [phone] 07/07/2017, 11:41 AM   R RF PIP Dx, now reduced, will f/u in office  Darryl Crouchave Evens Meno, MD Hand Surgery

## 2018-06-15 ENCOUNTER — Other Ambulatory Visit: Payer: Self-pay

## 2018-06-15 ENCOUNTER — Encounter (HOSPITAL_BASED_OUTPATIENT_CLINIC_OR_DEPARTMENT_OTHER): Payer: Self-pay | Admitting: *Deleted

## 2018-06-15 ENCOUNTER — Emergency Department (HOSPITAL_BASED_OUTPATIENT_CLINIC_OR_DEPARTMENT_OTHER)
Admission: EM | Admit: 2018-06-15 | Discharge: 2018-06-15 | Disposition: A | Discharge status: Home / Self Care | Payer: No Typology Code available for payment source | Attending: Emergency Medicine | Admitting: Emergency Medicine

## 2018-06-15 DIAGNOSIS — Z23 Encounter for immunization: Secondary | ICD-10-CM | POA: Diagnosis not present

## 2018-06-15 DIAGNOSIS — S81811A Laceration without foreign body, right lower leg, initial encounter: Secondary | ICD-10-CM | POA: Diagnosis not present

## 2018-06-15 DIAGNOSIS — Y9389 Activity, other specified: Secondary | ICD-10-CM | POA: Diagnosis not present

## 2018-06-15 DIAGNOSIS — Y929 Unspecified place or not applicable: Secondary | ICD-10-CM | POA: Diagnosis not present

## 2018-06-15 DIAGNOSIS — Y998 Other external cause status: Secondary | ICD-10-CM | POA: Insufficient documentation

## 2018-06-15 DIAGNOSIS — S8991XA Unspecified injury of right lower leg, initial encounter: Secondary | ICD-10-CM | POA: Diagnosis present

## 2018-06-15 DIAGNOSIS — W228XXA Striking against or struck by other objects, initial encounter: Secondary | ICD-10-CM | POA: Diagnosis not present

## 2018-06-15 MED ORDER — LIDOCAINE-EPINEPHRINE (PF) 2 %-1:200000 IJ SOLN
INTRAMUSCULAR | Status: AC
  Administered 2018-06-15: 10 mL
  Filled 2018-06-15: qty 10

## 2018-06-15 MED ORDER — TETANUS-DIPHTH-ACELL PERTUSSIS 5-2.5-18.5 LF-MCG/0.5 IM SUSP
0.5000 mL | Freq: Once | INTRAMUSCULAR | Status: AC
  Administered 2018-06-15: 0.5 mL via INTRAMUSCULAR
  Filled 2018-06-15: qty 0.5

## 2018-06-15 MED ORDER — LIDOCAINE-EPINEPHRINE 2 %-1:100000 IJ SOLN
20.0000 mL | Freq: Once | INTRAMUSCULAR | Status: DC
  Filled 2018-06-15: qty 20

## 2018-06-15 NOTE — ED Triage Notes (Signed)
Laceration to his right lower leg. He was pushing a metal hand truck, turned and ran into another metal hand truck.

## 2018-06-15 NOTE — Discharge Instructions (Signed)
Take tylenol, motrin for pain.   Leave dressing on today. You may put a bandaid on tomorrow. Keep wound clean and dry.   Suture removal in a week   See your doctor  Return to ER earlier if you have fever, worse redness around the wound, purulent drainage, severe pain

## 2018-06-15 NOTE — ED Provider Notes (Signed)
[facility] EMERGENCY DEPARTMENT Provider Note   CSN: [id number] Arrival date & time: 06/15/18  1131     History   Chief Complaint Chief Complaint  Patient presents with  . Laceration    HPI Lenox Malveaux is a 52 y.o. male otherwise healthy, here with laceration. He was pushing a metal hand truck and turned and accidentally kick another hand truck. He denies any other injury. Unknown tdap.   The history is provided by the patient.    History reviewed. No pertinent past medical history.  There are no active problems to display for this patient.   History reviewed. No pertinent surgical history.      Home Medications    Prior to Admission medications   Medication Sig Start Date End Date Taking? Authorizing Provider  bismuth-metronidazole-tetracycline [facility]) [phone] MG capsule Take 3 capsules by mouth 4 (four) times daily -  before meals and at bedtime. 05/09/17   Meryl Dare, MD  naproxen (NAPROSYN) 500 MG tablet Take 1 tablet (500 mg total) by mouth 2 (two) times daily. 07/07/17   Joy, Shawn C, PA-C  omeprazole (PRILOSEC) 40 MG capsule Take 1 capsule (40 mg total) by mouth daily. 05/01/17   Meryl Dare, MD  promethazine (PHENERGAN) 25 MG tablet Take 1 tablet (25 mg total) by mouth every 6 (six) hours as needed for nausea or vomiting. 02/09/17   Bethann Berkshire, MD  ranitidine (ZANTAC) 150 MG tablet Take 1 tablet (150 mg total) by mouth 2 (two) times daily. 02/17/17   Esterwood, Amy S, PA-C  traMADol (ULTRAM) 50 MG tablet Take 1 tablet (50 mg total) by mouth every 6 (six) hours as needed. 02/09/17   Bethann Berkshire, MD    Family History No family history on file.  Social History Social History   Tobacco Use  . Smoking status: Never Smoker  . Smokeless tobacco: Never Used  Substance Use Topics  . Alcohol use: Yes    Comment: occasionally   . Drug use: No     Allergies   Patient has no known allergies.   Review of Systems Review of  Systems  Skin: Positive for wound.  All other systems reviewed and are negative.    Physical Exam Updated Vital Signs BP (!) 147/100   Pulse 72   Temp 98.3 F (36.8 C) (Oral)   Resp 20   Ht 6\' 2"  (1.88 m)   Wt 98.4 kg   SpO2 100%   BMI 27.86 kg/m   Physical Exam  Constitutional: He appears well-developed.  HENT:  Head: Normocephalic.  Eyes: Pupils are equal, round, and reactive to light.  Neck: Normal range of motion.  Cardiovascular: Normal rate.  Pulmonary/Chest: Effort normal.  Abdominal: Soft.  Musculoskeletal:  3 cm laceration R ankle area. There is fat exposed but no obvious tendons. Nl ROM of the ankle, able to plantar flex and dorsiflex. 2+ pulses   Neurological: He is alert.  Skin: Skin is warm. Capillary refill takes less than 2 seconds.  Psychiatric: He has a normal mood and affect.  Nursing note and vitals reviewed.    ED Treatments / Results  Labs (all labs ordered are listed, but only abnormal results are displayed) Labs Reviewed - No data to display  EKG None  Radiology No results found.  Procedures Procedures (including critical care time)  LACERATION REPAIR Performed by: Richardean Canal Authorized by: Richardean Canal Consent: Verbal consent obtained. Risks and benefits: risks, benefits and alternatives were discussed Consent  given by: patient Patient identity confirmed: provided demographic data Prepped and Draped in normal sterile fashion Wound explored  Laceration Location: R ankle  Laceration Length: 3 cm  No Foreign Bodies seen or palpated  Anesthesia: local infiltration  Local anesthetic: lidocaine 2% with epinephrine  Anesthetic total: 10 ml  Irrigation method: syringe Amount of cleaning: standard  Skin closure: 4-0 ethilon  Number of sutures: 3  Technique: simple interrupted   Patient tolerance: Patient tolerated the procedure well with no immediate complications.   Medications Ordered in ED Medications    lidocaine-EPINEPHrine (XYLOCAINE W/EPI) 2 %-1:100000 (with pres) injection 20 mL (has no administration in time range)  Tdap (BOOSTRIX) injection 0.5 mL (0.5 mLs Intramuscular Given 06/15/18 1324)  lidocaine-EPINEPHrine (XYLOCAINE W/EPI) 2 %-1:200000 (PF) injection (10 mLs  Given 06/15/18 1322)     Initial Impression / Assessment and Plan / ED Course  I have reviewed the triage vital signs and the nursing notes.  Pertinent labs & imaging results that were available during my care of the patient were reviewed by me and considered in my medical decision making (see chart for details).     London Swager is a 52 y.o. male here with laceration to R ankle area. Neurovascular intact. No obvious foreign body. Wound irrigated and cleaned. Placed 3 stitches. Recommend suture removal in a week.    Final Clinical Impressions(s) / ED Diagnoses   Final diagnoses:  None    ED Discharge Orders    None       Charlynne Pander, MD 06/15/18 1335

## 2019-05-18 ENCOUNTER — Other Ambulatory Visit: Payer: Self-pay

## 2019-05-18 DIAGNOSIS — Z20822 Contact with and (suspected) exposure to covid-19: Secondary | ICD-10-CM

## 2019-05-19 LAB — NOVEL CORONAVIRUS, NAA: SARS-CoV-2, NAA: DETECTED — AB

## 2019-05-31 ENCOUNTER — Other Ambulatory Visit: Payer: Self-pay

## 2019-05-31 DIAGNOSIS — Z20822 Contact with and (suspected) exposure to covid-19: Secondary | ICD-10-CM

## 2019-06-01 LAB — NOVEL CORONAVIRUS, NAA: SARS-CoV-2, NAA: NOT DETECTED

## 2019-12-20 ENCOUNTER — Ambulatory Visit: Payer: Self-pay | Attending: Internal Medicine

## 2019-12-20 DIAGNOSIS — Z20822 Contact with and (suspected) exposure to covid-19: Secondary | ICD-10-CM | POA: Insufficient documentation

## 2019-12-21 LAB — NOVEL CORONAVIRUS, NAA: SARS-CoV-2, NAA: NOT DETECTED

## 2019-12-21 LAB — SARS-COV-2, NAA 2 DAY TAT

## 2020-12-26 ENCOUNTER — Emergency Department (HOSPITAL_COMMUNITY)
Admission: EM | Admit: 2020-12-26 | Discharge: 2020-12-26 | Disposition: A | Discharge status: Home / Self Care | Payer: No Typology Code available for payment source | Attending: Emergency Medicine | Admitting: Emergency Medicine

## 2020-12-26 ENCOUNTER — Emergency Department (HOSPITAL_COMMUNITY): Payer: No Typology Code available for payment source

## 2020-12-26 ENCOUNTER — Encounter (HOSPITAL_COMMUNITY): Payer: Self-pay | Admitting: Emergency Medicine

## 2020-12-26 ENCOUNTER — Other Ambulatory Visit: Payer: Self-pay

## 2020-12-26 DIAGNOSIS — H5704 Mydriasis: Secondary | ICD-10-CM | POA: Diagnosis not present

## 2020-12-26 DIAGNOSIS — S0993XA Unspecified injury of face, initial encounter: Secondary | ICD-10-CM | POA: Diagnosis present

## 2020-12-26 DIAGNOSIS — S0012XA Contusion of left eyelid and periocular area, initial encounter: Secondary | ICD-10-CM | POA: Insufficient documentation

## 2020-12-26 DIAGNOSIS — W228XXA Striking against or struck by other objects, initial encounter: Secondary | ICD-10-CM | POA: Insufficient documentation

## 2020-12-26 DIAGNOSIS — Y990 Civilian activity done for income or pay: Secondary | ICD-10-CM | POA: Diagnosis not present

## 2020-12-26 DIAGNOSIS — H5712 Ocular pain, left eye: Secondary | ICD-10-CM

## 2020-12-26 MED ORDER — ERYTHROMYCIN 5 MG/GM OP OINT: TOPICAL_OINTMENT | OPHTHALMIC | 0 refills | Status: DC

## 2020-12-26 MED ORDER — FLUORESCEIN SODIUM 1 MG OP STRP
1.0000 | ORAL_STRIP | Freq: Once | OPHTHALMIC | Status: AC
  Administered 2020-12-26: 1 via OPHTHALMIC
  Filled 2020-12-26: qty 1

## 2020-12-26 MED ORDER — TETRACAINE HCL 0.5 % OP SOLN
2.0000 [drp] | Freq: Once | OPHTHALMIC | Status: AC
  Administered 2020-12-26: 2 [drp] via OPHTHALMIC
  Filled 2020-12-26: qty 4

## 2020-12-26 MED ORDER — ERYTHROMYCIN 5 MG/GM OP OINT
1.0000 "application " | TOPICAL_OINTMENT | Freq: Once | OPHTHALMIC | Status: AC
  Administered 2020-12-26: 1 via OPHTHALMIC
  Filled 2020-12-26: qty 3.5

## 2020-12-26 NOTE — ED Triage Notes (Signed)
Pt arrives to ED with c/o of pain to left eye after getting hit with a piece of plastic off an A/C at work. Pts eye is red, swollen, watery, and vision is blurry. No injury to head.

## 2020-12-26 NOTE — ED Triage Notes (Signed)
Emergency Medicine Provider Triage Evaluation Note  Edword Cu , a 55 y.o. male  was evaluated in triage.  Pt complains of eye pain after being hit at work. Blurry vision. Piece of plastic hit him.  Review of Systems  Positive: Eye pain, blurry vision Negative: Vision loss   Physical Exam  There were no vitals taken for this visit. Gen:   Awake, no distress , pt tearful  HEENT:  L eye not reacting, appears dialed. No open fracture. Central and peripheral vision intact.  Resp:  Normal effort  Cardiac:  Normal rate  Abd:   Nondistended, nontender  MSK:   Moves extremities without difficulty  Neuro:  Speech clear  Medical Decision Making  Medically screening exam initiated at 3:35 PM.  Appropriate orders placed.  Jakyri Fehr was informed that the remainder of the evaluation will be completed by another provider, this initial triage assessment does not replace that evaluation, and the importance of remaining in the ED until their evaluation is complete.  Clinical Impression  L eye injury, not reacting, needs to be seen next. Triange nurse aware, pt moving back.      Farrel Gordon, PA-C 12/26/20 1540

## 2020-12-26 NOTE — ED Provider Notes (Signed)
[facility] EMERGENCY DEPARTMENT Provider Note   CSN: [id number] Arrival date & time: 12/26/20  1525     History Chief Complaint  Patient presents with  . Eye Pain    Darryl Hull is a 55 y.o. male.  Patient struck in the left eye with a plastic object at work.  Has blurry vision.  Did not lose consciousness.  Not on blood thinners.  States that he has cataracts in the right eye at baseline.  Has not seen ophthalmology about this since moving from [place].  Denies any foreign body sensation in his eye.  The history is provided by the patient.  Eye Pain This is a new problem. The current episode started less than 1 hour ago. The problem occurs constantly. The problem has not changed since onset.Pertinent negatives include no chest pain, no abdominal pain, no headaches and no shortness of breath. Nothing aggravates the symptoms. Nothing relieves the symptoms. He has tried nothing for the symptoms. The treatment provided no relief.       History reviewed. No pertinent past medical history.  There are no problems to display for this patient.   History reviewed. No pertinent surgical history.     History reviewed. No pertinent family history.  Social History   Tobacco Use  . Smoking status: Never Smoker  . Smokeless tobacco: Never Used  Substance Use Topics  . Alcohol use: Yes    Comment: occasionally   . Drug use: No    Home Medications Prior to Admission medications   Medication Sig Start Date End Date Taking? Authorizing Provider  erythromycin ophthalmic ointment Place a 1/2 inch ribbon of ointment into the lower eyelid four times a day for 5 days 12/26/20  Yes Serafino Burciaga, DO  bismuth-metronidazole-tetracycline [facility]) [phone] MG capsule Take 3 capsules by mouth 4 (four) times daily -  before meals and at bedtime. 05/09/17   Meryl Dare, MD  naproxen (NAPROSYN) 500 MG tablet Take 1 tablet (500 mg total) by mouth 2 (two) times daily.  07/07/17   Joy, Shawn C, PA-C  omeprazole (PRILOSEC) 40 MG capsule Take 1 capsule (40 mg total) by mouth daily. 05/01/17   Meryl Dare, MD  promethazine (PHENERGAN) 25 MG tablet Take 1 tablet (25 mg total) by mouth every 6 (six) hours as needed for nausea or vomiting. 02/09/17   Bethann Berkshire, MD  ranitidine (ZANTAC) 150 MG tablet Take 1 tablet (150 mg total) by mouth 2 (two) times daily. 02/17/17   Esterwood, Amy S, PA-C  traMADol (ULTRAM) 50 MG tablet Take 1 tablet (50 mg total) by mouth every 6 (six) hours as needed. 02/09/17   Bethann Berkshire, MD    Allergies    Patient has no known allergies.  Review of Systems   Review of Systems  Constitutional: Negative for chills and fever.  HENT: Negative for ear pain and sore throat.   Eyes: Positive for pain, redness and visual disturbance.  Respiratory: Negative for cough and shortness of breath.   Cardiovascular: Negative for chest pain and palpitations.  Gastrointestinal: Negative for abdominal pain and vomiting.  Genitourinary: Negative for dysuria and hematuria.  Musculoskeletal: Negative for arthralgias and back pain.  Skin: Negative for color change and rash.  Neurological: Negative for seizures, syncope and headaches.  All other systems reviewed and are negative.   Physical Exam Updated Vital Signs BP (!) 142/99 Comment: RA  Pulse 95 Comment: RA  Temp 98 F (36.7 C) (Oral)   Resp  20   SpO2 98% Comment: RA  Physical Exam Vitals and nursing note reviewed.  Constitutional:      General: He is not in acute distress.    Appearance: He is well-developed. He is not ill-appearing.  HENT:     Head: Normocephalic and atraumatic.  Eyes:     Conjunctiva/sclera: Conjunctivae normal.     Comments: Right pupil is with white in the pupil that patient states is chronic, left pupil has some ecchymosis around the upper eyelid but has normal extraocular motion bilaterally, pupil on the left is mildly dilated and minimally reactive, right  pupil equal and reactive, no obvious corneal abrasion, no Seidel sign on fluorescein staining, normal eye pressure in the left eye, no concern for open globe  Cardiovascular:     Rate and Rhythm: Normal rate and regular rhythm.     Heart sounds: No murmur heard.   Pulmonary:     Effort: Pulmonary effort is normal. No respiratory distress.     Breath sounds: Normal breath sounds.  Abdominal:     Palpations: Abdomen is soft.     Tenderness: There is no abdominal tenderness.  Musculoskeletal:        General: Normal range of motion.     Cervical back: Normal range of motion and neck supple. No tenderness.  Skin:    General: Skin is warm and dry.     Capillary Refill: Capillary refill takes less than 2 seconds.  Neurological:     General: No focal deficit present.     Mental Status: He is alert and oriented to person, place, and time.     Cranial Nerves: No cranial nerve deficit.     Sensory: No sensory deficit.     Motor: No weakness.     Coordination: Coordination normal.     ED Results / Procedures / Treatments   Labs (all labs ordered are listed, but only abnormal results are displayed) Labs Reviewed - No data to display  EKG None  Radiology CT Head Wo Contrast  Result Date: 12/26/2020 CLINICAL DATA:  Hit in the site of the head and left eye today at work. Headache and eye pain. EXAM: CT HEAD WITHOUT CONTRAST CT MAXILLOFACIAL WITHOUT CONTRAST TECHNIQUE: Multidetector CT imaging of the head and maxillofacial structures were performed using the standard protocol without intravenous contrast. Multiplanar CT image reconstructions of the maxillofacial structures were also generated. COMPARISON:  None. FINDINGS: CT HEAD FINDINGS Brain: There is no evidence for acute hemorrhage, hydrocephalus, mass lesion, or abnormal extra-axial fluid collection. No definite CT evidence for acute infarction. Vascular: No hyperdense vessel or unexpected calcification. Skull: No evidence for fracture.  No worrisome lytic or sclerotic lesion. Other: None. CT MAXILLOFACIAL FINDINGS Osseous: No fracture or mandibular dislocation. No destructive process. Orbits: Negative. No traumatic or inflammatory finding. Status post right lens replacement Sinuses: Mild chronic mucosal disease noted left maxillary sinus. Remaining paranasal sinuses and mastoid air cells are clear. Soft tissues: Negative. IMPRESSION: 1. Unremarkable CT brain.  No acute intracranial abnormality. 2. No evidence for an acute maxillofacial fracture. 3. Mild chronic left maxillary sinus disease. Electronically Signed   By: Kennith Center M.D.   On: 12/26/2020 17:21   CT Maxillofacial Wo Contrast  Result Date: 12/26/2020 CLINICAL DATA:  Hit in the site of the head and left eye today at work. Headache and eye pain. EXAM: CT HEAD WITHOUT CONTRAST CT MAXILLOFACIAL WITHOUT CONTRAST TECHNIQUE: Multidetector CT imaging of the head and maxillofacial structures were performed using the  standard protocol without intravenous contrast. Multiplanar CT image reconstructions of the maxillofacial structures were also generated. COMPARISON:  None. FINDINGS: CT HEAD FINDINGS Brain: There is no evidence for acute hemorrhage, hydrocephalus, mass lesion, or abnormal extra-axial fluid collection. No definite CT evidence for acute infarction. Vascular: No hyperdense vessel or unexpected calcification. Skull: No evidence for fracture. No worrisome lytic or sclerotic lesion. Other: None. CT MAXILLOFACIAL FINDINGS Osseous: No fracture or mandibular dislocation. No destructive process. Orbits: Negative. No traumatic or inflammatory finding. Status post right lens replacement Sinuses: Mild chronic mucosal disease noted left maxillary sinus. Remaining paranasal sinuses and mastoid air cells are clear. Soft tissues: Negative. IMPRESSION: 1. Unremarkable CT brain.  No acute intracranial abnormality. 2. No evidence for an acute maxillofacial fracture. 3. Mild chronic left maxillary  sinus disease. Electronically Signed   By: Kennith Center M.D.   On: 12/26/2020 17:21    Procedures Procedures   Medications Ordered in ED Medications  fluorescein ophthalmic strip 1 strip (has no administration in time range)  tetracaine (PONTOCAINE) 0.5 % ophthalmic solution 2 drop (has no administration in time range)  erythromycin ophthalmic ointment 1 application (has no administration in time range)    ED Course  I have reviewed the triage vital signs and the nursing notes.  Pertinent labs & imaging results that were available during my care of the patient were reviewed by me and considered in my medical decision making (see chart for details).    MDM Rules/Calculators/A&P                           Dahir Ridings is here after being hit in the eye with a plastic object.  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  He thought it was from cataracts but confirmed with family that it was from a childhood accident.  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  Head and face CT were unremarkable.  No obvious corneal abrasion.  No increased intraocular pressure on Tono-Pen exam.  Eye pressure was 22.  No concern for open globe.  Given erythromycin.  Will follow-up with ophthalmology.  Discharged in good condition.  No signs of entrapment on exam.  No orbital fracture.  This chart was dictated using voice recognition software.  Despite best efforts to proofread,  errors can occur which can change the documentation meaning.     Final Clinical Impression(s) / ED Diagnoses Final diagnoses:  Traumatic mydriasis  Pain of left eye    Rx / DC Orders ED Discharge Orders         Ordered    erythromycin ophthalmic ointment        12/26/20 1900           Virgina Norfolk, DO 12/26/20 2334

## 2020-12-26 NOTE — ED Notes (Signed)
Patient verbalizes understanding of discharge instructions. Opportunity for questioning and answers were provided. Armband removed by staff, pt discharged from ED.  

## 2020-12-26 NOTE — Discharge Instructions (Signed)
Suspect that you have traumatic mydriasis/dilation of the eye.  This should improve.  Did not see any obvious corneal abrasion but take eye ointment until you follow-up with eye doctor.  Follow-up with Dr. Sherryll Burger.

## 2020-12-27 ENCOUNTER — Encounter (HOSPITAL_COMMUNITY): Payer: Self-pay | Admitting: Emergency Medicine

## 2021-04-30 ENCOUNTER — Other Ambulatory Visit: Payer: Self-pay

## 2021-04-30 ENCOUNTER — Emergency Department (HOSPITAL_COMMUNITY): Payer: Self-pay

## 2021-04-30 ENCOUNTER — Encounter (HOSPITAL_COMMUNITY): Payer: Self-pay

## 2021-04-30 ENCOUNTER — Emergency Department (HOSPITAL_COMMUNITY)
Admission: EM | Admit: 2021-04-30 | Discharge: 2021-05-01 | Disposition: A | Discharge status: Home / Self Care | Payer: Self-pay | Attending: Physician Assistant | Admitting: Physician Assistant

## 2021-04-30 DIAGNOSIS — X509XXA Other and unspecified overexertion or strenuous movements or postures, initial encounter: Secondary | ICD-10-CM | POA: Insufficient documentation

## 2021-04-30 DIAGNOSIS — Y9289 Other specified places as the place of occurrence of the external cause: Secondary | ICD-10-CM | POA: Insufficient documentation

## 2021-04-30 DIAGNOSIS — Y990 Civilian activity done for income or pay: Secondary | ICD-10-CM | POA: Insufficient documentation

## 2021-04-30 DIAGNOSIS — Z5321 Procedure and treatment not carried out due to patient leaving prior to being seen by health care provider: Secondary | ICD-10-CM | POA: Insufficient documentation

## 2021-04-30 DIAGNOSIS — M545 Low back pain, unspecified: Secondary | ICD-10-CM | POA: Insufficient documentation

## 2021-04-30 NOTE — ED Triage Notes (Signed)
Patient complains of lower back pain after lifting something at work today. Pain with any ROM. Has taken ibuprofen x 1

## 2021-04-30 NOTE — ED Provider Notes (Signed)
Emergency Medicine Provider Triage Evaluation Note  Darryl Hull , a 55 y.o. male  was evaluated in triage.  Pt complains of left-sided lower back pain.  He states that earlier today at about 10 in the morning he was at work lifting a heavy object and felt a pain in his left-sided back.  It has been worsening throughout the day.  He took 1 pill of ibuprofen prior to arrival.  He is able to walk however states it is painful.  He denies any changes to bowel or bladder function.  He states that he has injured his back before however it was never this severe.  No pain in his chest or abdomen.  No numbness or tingling..  Review of Systems  Positive: Back pain Negative: fevers  Physical Exam  BP (!) 138/94 (BP Location: Left Arm)   Pulse 91   Temp 98.2 F (36.8 C)   Resp 17   SpO2 100%  Gen:   Awake, no distress   Resp:  Normal effort  MSK:   Moves extremities without difficulty  Other:  Able to slowly ambulate  Medical Decision Making  Medically screening exam initiated at 6:53 PM.  Appropriate orders placed.  Darryl Hull was informed that the remainder of the evaluation will be completed by another provider, this initial triage assessment does not replace that evaluation, and the importance of remaining in the ED until their evaluation is complete.  Interactions with patient performed through professional Spanish-speaking medical interpreter.  Note: Portions of this report may have been transcribed using voice recognition software. Every effort was made to ensure accuracy; however, inadvertent computerized transcription errors may be present    Darryl Hull 04/30/21 1855    Koleen Distance, MD 04/30/21 [phone]

## 2021-05-01 ENCOUNTER — Emergency Department (HOSPITAL_COMMUNITY)
Admission: EM | Admit: 2021-05-01 | Discharge: 2021-05-01 | Disposition: A | Discharge status: Home / Self Care | Payer: Self-pay | Attending: Medical | Admitting: Medical

## 2021-05-01 DIAGNOSIS — X500XXA Overexertion from strenuous movement or load, initial encounter: Secondary | ICD-10-CM | POA: Insufficient documentation

## 2021-05-01 DIAGNOSIS — Y990 Civilian activity done for income or pay: Secondary | ICD-10-CM | POA: Insufficient documentation

## 2021-05-01 DIAGNOSIS — M5442 Lumbago with sciatica, left side: Secondary | ICD-10-CM | POA: Insufficient documentation

## 2021-05-01 MED ORDER — PREDNISONE 10 MG (21) PO TBPK: ORAL_TABLET | Freq: Every day | ORAL | 0 refills | Status: DC

## 2021-05-01 MED ORDER — METHOCARBAMOL 500 MG PO TABS: 500.0000 mg | ORAL_TABLET | Freq: Two times a day (BID) | ORAL | 0 refills | Status: DC

## 2021-05-01 NOTE — Discharge Instructions (Addendum)
Please pick up medication and take as prescribed. DO NOT DRIVE OR DRINK ALCOHOL WHILE ON THE MUSCLE RELAXER AS IT CAN MAKE YOU DROWSY.  Do not take Ibuprofen, Advil, Aleve or other NSAIDs with the prednisone. You can take Tylenol in addition to the prednisone.   Follow up with [facility] for primary care needs  Return to the ED for any new/worsening symptoms

## 2021-05-01 NOTE — ED Triage Notes (Signed)
Pt here back tot he ED with continued complaints of back pain , after lifting something  heavy at work , had x ray last night , lwbs last night

## 2021-05-01 NOTE — ED Notes (Signed)
Pt states he is leaving °

## 2021-05-02 NOTE — ED Provider Notes (Signed)
[facility] EMERGENCY DEPARTMENT Provider Note   CSN: [id number] Arrival date & time: 05/01/21  1033     History No chief complaint on file.   Darryl Hull is a 55 y.o. male who presents to the ED today with complaint of sudden onset, constant, achy, left lower back pain radiating down LLE that began 1 day ago. Pt reports he was lifting something heavy at work when he felt sudden pain. He has been taking OTC medications without relief. He presented to the ED yesterday for same and had an xray done which did not show any acute findings. Pt left prior to being evaluated by a provider in the back however was medically screened. He reports he is back today with continued pain. He denies fevers, chills, saddle anesthesia, urinary retention, urinary or bowel incontinence, weakness/numbness to lower extremities, or any other associated symptoms.   The history is provided by the patient and medical records. The history is limited by a language barrier. A language interpreter was used.      No past medical history on file.  There are no problems to display for this patient.   No past surgical history on file.     No family history on file.  Social History   Tobacco Use   Smoking status: Never   Smokeless tobacco: Never  Substance Use Topics   Alcohol use: Yes    Comment: occasionally    Drug use: No    Home Medications Prior to Admission medications   Medication Sig Start Date End Date Taking? Authorizing Provider  methocarbamol (ROBAXIN) 500 MG tablet Take 1 tablet (500 mg total) by mouth 2 (two) times daily. 05/01/21  Yes Talana Slatten, PA-C  predniSONE (STERAPRED UNI-PAK 21 TAB) 10 MG (21) TBPK tablet Take by mouth daily. Follow package insert 05/01/21  Yes Hyman Hopes, Julaine Zimny, PA-C  bismuth-metronidazole-tetracycline [facility]) [id number] MG capsule Take 3 capsules by mouth 4 (four) times daily -  before meals and at bedtime. 05/09/17   Meryl Dare, MD   erythromycin ophthalmic ointment Place a 1/2 inch ribbon of ointment into the lower eyelid four times a day for 5 days 12/26/20   Virgina Norfolk, DO  naproxen (NAPROSYN) 500 MG tablet Take 1 tablet (500 mg total) by mouth 2 (two) times daily. 07/07/17   Joy, Shawn C, PA-C  omeprazole (PRILOSEC) 40 MG capsule Take 1 capsule (40 mg total) by mouth daily. 05/01/17   Meryl Dare, MD  promethazine (PHENERGAN) 25 MG tablet Take 1 tablet (25 mg total) by mouth every 6 (six) hours as needed for nausea or vomiting. 02/09/17   Bethann Berkshire, MD  ranitidine (ZANTAC) 150 MG tablet Take 1 tablet (150 mg total) by mouth 2 (two) times daily. 02/17/17   Esterwood, Amy S, PA-C  traMADol (ULTRAM) 50 MG tablet Take 1 tablet (50 mg total) by mouth every 6 (six) hours as needed. 02/09/17   Bethann Berkshire, MD    Allergies    Patient has no known allergies.  Review of Systems   Review of Systems  Constitutional:  Negative for chills and fever.  Genitourinary:  Negative for difficulty urinating.  Musculoskeletal:  Positive for back pain.  Neurological:  Negative for weakness and numbness.  All other systems reviewed and are negative.  Physical Exam Updated Vital Signs BP (!) 133/92 (BP Location: Right Arm)   Pulse 72   Temp 98.6 F (37 C) (Oral)   Resp 16   SpO2 100%   Physical  Exam Vitals and nursing note reviewed.  Constitutional:      Appearance: He is not ill-appearing.  HENT:     Head: Normocephalic and atraumatic.  Eyes:     Conjunctiva/sclera: Conjunctivae normal.  Cardiovascular:     Rate and Rhythm: Normal rate and regular rhythm.  Pulmonary:     Effort: Pulmonary effort is normal.     Breath sounds: Normal breath sounds.  Musculoskeletal:     Comments: No C, T, or L midline spinal TTP. + left paralumbar musculature TTP. ROM intact to neck and back. Strength 5/5 to BLEs. Sensation intact throughout. 2+ PT pulses bilaterally.   Skin:    General: Skin is warm and dry.     Coloration:  Skin is not jaundiced.  Neurological:     Mental Status: He is alert.    ED Results / Procedures / Treatments   Labs (all labs ordered are listed, but only abnormal results are displayed) Labs Reviewed - No data to display  EKG None  Radiology DG Lumbar Spine Complete  Result Date: 04/30/2021 CLINICAL DATA:  Left back pain EXAM: LUMBAR SPINE - COMPLETE 4+ VIEW COMPARISON:  None. FINDINGS: There is no evidence of lumbar spine fracture. Alignment is normal. Intervertebral disc spaces are maintained. IMPRESSION: Negative. Electronically Signed   By: Helyn Numbers M.D.   On: 04/30/2021 19:42    Procedures Procedures   Medications Ordered in ED Medications - No data to display  ED Course  I have reviewed the triage vital signs and the nursing notes.  Pertinent labs & imaging results that were available during my care of the patient were reviewed by me and considered in my medical decision making (see chart for details).    MDM Rules/Calculators/A&P                           55 year old Spanish speaking male presenting to the ED today with complaints of continued left lower back pain radiating down LLE that began yesterday. Xray yesterday in the ED without acute findings. On arrival to the ED today VSS and pt appears to be in NAD. Personally visualized pt ambulating from waiting room to triage without difficulty. He has no midline spinal TTP on my exam. He is neurovascularly intact. He does not attribute any red flag symptoms concerning for cauda equina, spinal epidural abscess, or AAA. Suspect muscle strain vs sciatica at this time. Will plan to discharge with muscle relaxer and steroids and PCP follow up. Pt in agreement with plan and stable for discharge home.   This note was prepared using Dragon voice recognition software and may include unintentional dictation errors due to the inherent limitations of voice recognition software.   Final Clinical Impression(s) / ED  Diagnoses Final diagnoses:  Acute left-sided low back pain with left-sided sciatica    Rx / DC Orders ED Discharge Orders          Ordered    methocarbamol (ROBAXIN) 500 MG tablet  2 times daily        05/01/21 1146    predniSONE (STERAPRED UNI-PAK 21 TAB) 10 MG (21) TBPK tablet  Daily        05/01/21 1146             Discharge Instructions      Please pick up medication and take as prescribed. DO NOT DRIVE OR DRINK ALCOHOL WHILE ON THE MUSCLE RELAXER AS IT CAN MAKE YOU DROWSY.  Do not take Ibuprofen, Advil, Aleve or other NSAIDs with the prednisone. You can take Tylenol in addition to the prednisone.   Follow up with [facility] for primary care needs  Return to the ED for any new/worsening symptoms       Tanda Rockers, PA-C 05/02/21 1437    Rolan Bucco, MD 05/12/21 0700

## 2021-05-07 ENCOUNTER — Emergency Department (HOSPITAL_COMMUNITY)
Admission: EM | Admit: 2021-05-07 | Discharge: 2021-05-07 | Disposition: A | Discharge status: Home / Self Care | Payer: Self-pay | Attending: Emergency Medicine | Admitting: Emergency Medicine

## 2021-05-07 ENCOUNTER — Other Ambulatory Visit: Payer: Self-pay

## 2021-05-07 DIAGNOSIS — M5442 Lumbago with sciatica, left side: Secondary | ICD-10-CM | POA: Insufficient documentation

## 2021-05-07 MED ORDER — LIDOCAINE 5 % EX PTCH: 1.0000 | MEDICATED_PATCH | CUTANEOUS | 0 refills | Status: DC

## 2021-05-07 NOTE — ED Provider Notes (Signed)
[facility] EMERGENCY DEPARTMENT Provider Note   CSN: [id number] Arrival date & time: 05/07/21  1026     History No chief complaint on file.   Darryl Hull is a 55 y.o. male.  No notable past medical history.  Patient presents to the ED with lower back pain that radiates down to his left leg.  He was seen in the emergency department last Monday and was discharged home with prednisone taper and Robaxin.  He returns today stating that his pain has been unchanged and the medications have not helped.  He needs an extended work note until he can go see the orthopedic doctor.  He has not set up an appointment.  His pain is isolated to his left lower back.  Radiates down his left leg.  He denies any urinary incontinence or retention, bowel incontinence, fever, chills.  History obtained via interpreter device.    No past medical history on file.  There are no problems to display for this patient.   No past surgical history on file.     No family history on file.  Social History   Tobacco Use   Smoking status: Never   Smokeless tobacco: Never  Substance Use Topics   Alcohol use: Yes    Comment: occasionally    Drug use: No    Home Medications Prior to Admission medications   Medication Sig Start Date End Date Taking? Authorizing Provider  lidocaine (LIDODERM) 5 % Place 1 patch onto the skin daily. Remove & Discard patch within 12 hours or as directed by MD 05/07/21  Yes Jenine Krisher, Finis Bud, PA-C  bismuth-metronidazole-tetracycline (PYLERA) [phone] MG capsule Take 3 capsules by mouth 4 (four) times daily -  before meals and at bedtime. 05/09/17   Meryl Dare, MD  erythromycin ophthalmic ointment Place a 1/2 inch ribbon of ointment into the lower eyelid four times a day for 5 days 12/26/20   Virgina Norfolk, DO  methocarbamol (ROBAXIN) 500 MG tablet Take 1 tablet (500 mg total) by mouth 2 (two) times daily. 05/01/21   Hyman Hopes, Margaux, PA-C  naproxen  (NAPROSYN) 500 MG tablet Take 1 tablet (500 mg total) by mouth 2 (two) times daily. 07/07/17   Joy, Shawn C, PA-C  omeprazole (PRILOSEC) 40 MG capsule Take 1 capsule (40 mg total) by mouth daily. 05/01/17   Meryl Dare, MD  predniSONE (STERAPRED UNI-PAK 21 TAB) 10 MG (21) TBPK tablet Take by mouth daily. Follow package insert 05/01/21   Tanda Rockers, PA-C  promethazine (PHENERGAN) 25 MG tablet Take 1 tablet (25 mg total) by mouth every 6 (six) hours as needed for nausea or vomiting. 02/09/17   Bethann Berkshire, MD  ranitidine (ZANTAC) 150 MG tablet Take 1 tablet (150 mg total) by mouth 2 (two) times daily. 02/17/17   Esterwood, Amy S, PA-C  traMADol (ULTRAM) 50 MG tablet Take 1 tablet (50 mg total) by mouth every 6 (six) hours as needed. 02/09/17   Bethann Berkshire, MD    Allergies    Patient has no known allergies.  Review of Systems   Review of Systems  Constitutional:  Negative for chills and fever.  Musculoskeletal:  Positive for back pain. Negative for gait problem and neck pain.  Neurological:  Negative for weakness and numbness.  Psychiatric/Behavioral:  Positive for agitation.   All other systems reviewed and are negative.  Physical Exam Updated Vital Signs BP (!) 157/106   Pulse 94   Temp 98.2 F (36.8 C) (Oral)  Resp 14   SpO2 100%   Physical Exam Vitals and nursing note reviewed.  Constitutional:      General: He is not in acute distress.    Appearance: Normal appearance. He is not ill-appearing, toxic-appearing or diaphoretic.  HENT:     Head: Normocephalic and atraumatic.  Eyes:     General: No scleral icterus.       Right eye: No discharge.        Left eye: No discharge.     Conjunctiva/sclera: Conjunctivae normal.  Pulmonary:     Effort: Pulmonary effort is normal. No respiratory distress.  Musculoskeletal:     Lumbar back: Tenderness present. No swelling, deformity or bony tenderness. Normal range of motion.     Comments: Pain isolated to left lumbar  paraspinal muscles. No midline tenderness. Sciatica present.   Skin:    General: Skin is warm and dry.  Neurological:     Mental Status: He is alert.  Psychiatric:        Mood and Affect: Mood normal.        Behavior: Behavior normal.    ED Results / Procedures / Treatments   Labs (all labs ordered are listed, but only abnormal results are displayed) Labs Reviewed - No data to display  EKG None  Radiology No results found.  Procedures Procedures   Medications Ordered in ED Medications - No data to display  ED Course  I have reviewed the triage vital signs and the nursing notes.  Pertinent labs & imaging results that were available during my care of the patient were reviewed by me and considered in my medical decision making (see chart for details).    MDM Rules/Calculators/A&P                          This is a 55 y.o. male who is well appearing. He presents to the ED with continued lower back pain with left sided sciatica. His pain has not been controlled after being given a Prednisone taper and Robaxin exactly 1 week ago. While trying to obtain a history, patient became verbally aggressive. I deescalated the issue. He has no red flag symptoms. I told him to continue the Prednisone taper and take the Robaxin as needed. I will prescribe him a Lidocaine patch to help with the pain. I urged him that he needs to set up an appointment with Ortho outpatient. Gave him extended work note.   Final Clinical Impression(s) / ED Diagnoses Final diagnoses:  Acute left-sided low back pain with left-sided sciatica    Rx / DC Orders ED Discharge Orders          Ordered    lidocaine (LIDODERM) 5 %  Every 24 hours        05/07/21 1509             Claudie Leach, PA-C 05/07/21 1547    Wynetta Fines, MD 05/08/21 1006

## 2021-05-07 NOTE — ED Notes (Signed)
Discharge instructions reviewed and explained. Follow up care reinforced, pt verbalized understanding.

## 2021-05-07 NOTE — ED Provider Notes (Signed)
Emergency Medicine Provider Triage Evaluation Note  Darryl Hull , a 55 y.o. male  was evaluated in triage.  Pt complains of radicular leg pain.  Review of Systems  Positive: L lower back pain, radicular left leg pain Negative: Fever, chills, bowel/bladder incontinence, saddle anesthesia  Physical Exam  There were no vitals taken for this visit. Gen:   Awake, no distress   Resp:  Normal effort  MSK:   Moves extremities without difficulty  Other:  +SLR left  Medical Decision Making  Medically screening exam initiated at 11:00 AM.  Appropriate orders placed.  Darryl Hull was informed that the remainder of the evaluation will be completed by another provider, this initial triage assessment does not replace that evaluation, and the importance of remaining in the ED until their evaluation is complete.  Pt was seen on 8/23 for back pain and leg pain, diagnosed with sciatica.  Report current treatment is not effective and he's not ready to get back to work yet.    Fayrene Helper, PA-C 05/07/21 1101    Gloris Manchester, MD 05/09/21 [phone]

## 2021-05-07 NOTE — Discharge Instructions (Addendum)
You have been seen in the ED for lower back pain. Please schedule an appointment with orthopedics. Continue taking current medications for back pain. I have prescribed a lidocaine patch for you to use to help with the pain.

## 2021-05-07 NOTE — ED Triage Notes (Signed)
Pt reports continued back pain with radiation down leg-seen for same last week and was written out of work until Thursday and is requesting further time out of work. Taking prednisone with very minimal relief. Has not followed up with ortho with whom he was directed to see.

## 2021-05-28 ENCOUNTER — Ambulatory Visit (INDEPENDENT_AMBULATORY_CARE_PROVIDER_SITE_OTHER): Payer: Worker's Compensation | Admitting: Orthopaedic Surgery

## 2021-05-28 ENCOUNTER — Other Ambulatory Visit: Payer: Self-pay

## 2021-05-28 DIAGNOSIS — M545 Low back pain, unspecified: Secondary | ICD-10-CM | POA: Diagnosis not present

## 2021-05-28 MED ORDER — TRAMADOL HCL 50 MG PO TABS: 50.0000 mg | ORAL_TABLET | Freq: Four times a day (QID) | ORAL | 0 refills | Status: DC | PRN

## 2021-05-28 MED ORDER — TIZANIDINE HCL 4 MG PO TABS: 4.0000 mg | ORAL_TABLET | Freq: Three times a day (TID) | ORAL | 0 refills | Status: DC | PRN

## 2021-05-28 MED ORDER — NABUMETONE 500 MG PO TABS: 500.0000 mg | ORAL_TABLET | Freq: Two times a day (BID) | ORAL | 1 refills | Status: DC | PRN

## 2021-05-28 NOTE — Progress Notes (Signed)
Office Visit Note   Patient: Darryl Hull           Date of Birth: [birth year]           MRN: [id number] Visit Date: 05/28/2021              Requested by: No referring provider defined for this encounter. PCP: Patient, No Pcp Per (Inactive)   Assessment & Plan: Visit Diagnoses:  1. Acute bilateral low back pain without sciatica     Plan: Due to his significant low back pain, I would like to send him for a physical therapy evaluation with any treatment modalities per the therapist discretion.  I will give him a note reflecting that he should still avoid heavy work for the next 4 to 6 weeks.  This will be lifting no greater than 20 pounds and limiting flexion and extension of the spine with periods of time to rest his back.  We will continue muscle relaxant as well as tramadol and anti-inflammatory for him.  All questions and concerns were answered and addressed through the interpreter.  I would like to see him back in 4 weeks for repeat evaluation.  No x-rays are needed.  Follow-Up Instructions: Return in about 4 weeks (around 06/25/2021).   Orders:  No orders of the defined types were placed in this encounter.  No orders of the defined types were placed in this encounter.     Procedures: No procedures performed   Clinical Data: No additional findings.   Subjective: Chief Complaint  Patient presents with   Lower Back - Pain, Injury  The patient comes in for evaluation treatment of acute low back pain that has been going on for about a month now.  This was work-related.  He was lifting something heavy at work on August 22 and he felt a pop in his back.  He went to the emergency room for further evaluation and treatment.  X-rays were negative.  They put him on a steroid taper as well as Robaxin and tramadol.  He then stayed at work for the last 2 weeks.  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  He does have an interpreter  with him today.  He is non-English-speaking.  He does report significant low back pain across his lower back but it does not radiate down his left leg.  It did earlier but that is gone.  He denies any change in bowel or bladder function.  He has never had back surgery or injured his back before.  HPI  Review of Systems There is currently listed no headache, chest pain, shortness of breath, fever, chills, nausea, vomiting  Objective: Vital Signs: There were no vitals taken for this visit.  Physical Exam He is alert and oriented in no acute distress Ortho Exam Examination of the lumbar spine shows pain with flexion and extension of the lumbar spine across the lower lumbar spine but no limitations in his motion.  He is negative straight leg raise bilaterally and normal sensation in all dermatomes and normal strength in all muscle groups. Specialty Comments:  No specialty comments available.  Imaging: No results found. Plain films of the lumbar spine from a few weeks ago show a normal-appearing lumbar spine with no malalignment or other abnormalities.  PMFS History: There are no problems to display for this patient.  No past medical history on file.  No family history on file.  No past surgical history on file. Social History   Occupational History   Not on file  Tobacco Use   Smoking status: Never   Smokeless tobacco: Never  Substance and Sexual Activity   Alcohol use: Yes    Comment: occasionally    Drug use: No   Sexual activity: Not on file

## 2021-06-25 ENCOUNTER — Encounter: Payer: Self-pay | Admitting: Orthopaedic Surgery

## 2021-06-25 ENCOUNTER — Ambulatory Visit (INDEPENDENT_AMBULATORY_CARE_PROVIDER_SITE_OTHER): Payer: Worker's Compensation | Admitting: Orthopaedic Surgery

## 2021-06-25 DIAGNOSIS — M5441 Lumbago with sciatica, right side: Secondary | ICD-10-CM | POA: Diagnosis not present

## 2021-06-25 MED ORDER — HYDROCODONE-ACETAMINOPHEN 5-325 MG PO TABS: 1.0000 | ORAL_TABLET | Freq: Four times a day (QID) | ORAL | 0 refills | Status: DC | PRN

## 2021-06-25 NOTE — Progress Notes (Signed)
The patient is returning for follow-up still dealing with severe low back pain more on the right than the left.  He has developed significant radicular symptoms going down his right leg and in the sciatic region.  This all occurred after a work-related injury and accident.  He is non-English-speaking.  He states that interpreter that he has been denied Worker's Comp.  He is still working in spite of the severe pain he is having.  He has tried activity modification and rest.  He actually was out of work for about 8 days after this happened.  We sent him through physical therapy and that has not helped and things are certainly getting worse for him especially after sitting or laying down at night.  He is still reporting numbness and tingling and pain going down his right leg from the back down the sciatic region into the leg.  He is not a diabetic.  We did try a steroid taper as well.  He has a significantly positive straight leg raise on the right side and is very uncomfortable with flexion and extension of his lumbar spine.  There are radicular symptoms with numbness and tingling in the L4 and L5 distribution to the right side.  At this point given his profound radicular symptoms, a MRI of the lumbar spine is warranted to rule out nerve compression.  I will need to keep him out of work at this standpoint given the significance of his pain and I gave him a work note reflecting that.  I would try a short course of hydrocodone for severe pain and will have him avoid heavy lifting activities and against out of work completely until we have a treatment plan and have a MRI of his lumbar spine.  All question concerns were answered and addressed through the interpreter.  He should schedule follow-up appointment as soon as we have the MRI of his lumbar spine.

## 2021-06-26 ENCOUNTER — Other Ambulatory Visit: Payer: Self-pay

## 2021-06-26 DIAGNOSIS — M4807 Spinal stenosis, lumbosacral region: Secondary | ICD-10-CM

## 2021-06-29 ENCOUNTER — Telehealth: Payer: Self-pay | Admitting: Orthopaedic Surgery

## 2021-06-29 NOTE — Telephone Encounter (Signed)
Called 1X and left vm to call and set appt for MRI Review with Bdr. lackman after 07/16/21

## 2021-07-02 ENCOUNTER — Telehealth: Payer: Self-pay | Admitting: Orthopaedic Surgery

## 2021-07-02 NOTE — Telephone Encounter (Signed)
Called pt 3x left a vm to set appt after 11/7 with Dr. Magnus Ivan for MRI review

## 2021-07-16 ENCOUNTER — Other Ambulatory Visit: Payer: Self-pay

## 2021-07-16 ENCOUNTER — Ambulatory Visit: Payer: Self-pay | Admitting: Orthopaedic Surgery

## 2021-07-16 ENCOUNTER — Telehealth: Payer: Self-pay | Admitting: Orthopaedic Surgery

## 2021-07-16 NOTE — Telephone Encounter (Signed)
Pt's mri appt was cancelled but he did not know why and wanted to know how soon can he get it done since he has been out of work for a while now and wants to go back. Pt unsure if he needs to speak with Magnus Ivan to get it resubmitted or if he needs to call [facility]. The best call back number is [phone].

## 2021-07-16 NOTE — Telephone Encounter (Signed)
[name] and I have already talked about this pt, his appt with [facility] was cancelled due to he is a wc case and per notes from [place] wc goes thru [facility] and they have to scheduled with them. Pt was in ofifce and was made aware

## 2021-07-17 NOTE — Telephone Encounter (Signed)
Pt came in today with daughter to translate. Pt was given his workers comp information to call and get sch'd. Pt can not sch through [place] per pervious note pt must call wc to get it sch'd.

## 2021-08-13 ENCOUNTER — Ambulatory Visit (INDEPENDENT_AMBULATORY_CARE_PROVIDER_SITE_OTHER): Payer: Worker's Compensation | Admitting: Orthopaedic Surgery

## 2021-08-13 ENCOUNTER — Other Ambulatory Visit: Payer: Self-pay

## 2021-08-13 ENCOUNTER — Encounter: Payer: Self-pay | Admitting: Orthopaedic Surgery

## 2021-08-13 DIAGNOSIS — M4807 Spinal stenosis, lumbosacral region: Secondary | ICD-10-CM

## 2021-08-13 DIAGNOSIS — M5126 Other intervertebral disc displacement, lumbar region: Secondary | ICD-10-CM

## 2021-08-13 DIAGNOSIS — M5441 Lumbago with sciatica, right side: Secondary | ICD-10-CM

## 2021-08-13 NOTE — Progress Notes (Signed)
The patient is a very pleasant non-English-speaking 55 year old who does have interpreter today.  He is being seen in follow-up after having an MRI of his lumbar spine.  He had a work-related injury that led to significant right-sided low back pain and sciatica.  After not improving with time and back extension exercises as well as anti-inflammatories and rest, a MRI was warranted.  He does report that he is feeling better but he still having some pain and sciatica but it is slightly improving.  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  That is probably what is bothering him based on the MRI.  He does have a shallow disc at L4-L5 but to the left side and he is asymptomatic to the left side.  He does still have a positive straight leg raise on the right side but he has no weakness and no numbness and tingling.  I will keep him out of work completely for the next 4 weeks.  We will reevaluate him in 4 weeks in terms of return to work status with restrictions.  If he does not improve the next step will be considering an epidural steroid injection.

## 2021-08-21 ENCOUNTER — Ambulatory Visit: Payer: Self-pay | Admitting: Orthopaedic Surgery

## 2021-09-06 ENCOUNTER — Encounter: Payer: Self-pay | Admitting: Physician Assistant

## 2021-09-06 ENCOUNTER — Other Ambulatory Visit: Payer: Self-pay

## 2021-09-06 ENCOUNTER — Ambulatory Visit (INDEPENDENT_AMBULATORY_CARE_PROVIDER_SITE_OTHER): Payer: Worker's Compensation | Admitting: Physician Assistant

## 2021-09-06 DIAGNOSIS — M5441 Lumbago with sciatica, right side: Secondary | ICD-10-CM

## 2021-09-06 NOTE — Progress Notes (Signed)
° °  Office Visit Note   Patient: Darryl Hull           Date of Birth: 1966/08/21           MRN: [id number] Visit Date: 09/06/2021              Requested by: No referring provider defined for this encounter. PCP: Patient, No Pcp Per (Inactive)   Assessment & Plan: Visit Diagnoses:  1. Acute right-sided low back pain with right-sided sciatica     Plan: We will have him return to work with the only restriction of no lifting greater than 40 pounds.  He will follow-up with [place] on as-needed basis.  Questions encouraged and answered.  He will follow-up if he develops any radicular symptoms down either leg or his back pain gets worse.   Follow-Up Instructions: No follow-ups on file.   Orders:  No orders of the defined types were placed in this encounter.  No orders of the defined types were placed in this encounter.     Procedures: No procedures performed   Clinical Data: No additional findings.   Subjective: Chief Complaint  Patient presents with   Lower Back - Pain, Follow-up    HPI Darryl Hull returns today for follow-up of his low back pain.  He asked that his daughter interpret for him today as he does not speak [place].  He does not want to use the interpreter that was provided.  He feels that overall his back pain is improved.  Still hurts at times but not having any radicular symptoms.  He is having no waking pain.  He feels that rest helps.  He has done some exercises as taught by therapy.  He is taking no medications.  He is ready to return to work with restriction of no lifting greater than 40 pounds.   Review of Systems See HPI  Objective: Vital Signs: There were no vitals taken for this visit.  Physical Exam General well-developed well-nourished male no acute distress Ortho Exam Lower extremities 5 out of 5 strength throughout against resistance.  Negative straight leg raise bilaterally.  He is able to come within a couple of inches of touching his toes and  has some low back pain with this. Specialty Comments:  No specialty comments available.  Imaging: No results found.   PMFS History: There are no problems to display for this patient.  History reviewed. No pertinent past medical history.  History reviewed. No pertinent family history.  History reviewed. No pertinent surgical history. Social History   Occupational History   Not on file  Tobacco Use   Smoking status: Never   Smokeless tobacco: Never  Substance and Sexual Activity   Alcohol use: Yes    Comment: occasionally    Drug use: No   Sexual activity: Not on file

## 2021-11-14 ENCOUNTER — Ambulatory Visit: Payer: Self-pay | Admitting: Orthopaedic Surgery

## 2021-11-15 ENCOUNTER — Telehealth: Payer: Self-pay

## 2021-11-15 NOTE — Telephone Encounter (Signed)
Patient called into the office stating is the restriction for him to lift no more than 40lb permanent or temporary ?  ? ?Please advise  ?

## 2021-11-19 NOTE — Telephone Encounter (Signed)
LMOM for patient of the below message  

## 2021-12-06 ENCOUNTER — Encounter: Payer: Self-pay | Admitting: Orthopaedic Surgery

## 2021-12-06 ENCOUNTER — Ambulatory Visit (INDEPENDENT_AMBULATORY_CARE_PROVIDER_SITE_OTHER): Payer: Worker's Compensation | Admitting: Orthopaedic Surgery

## 2021-12-06 DIAGNOSIS — M5126 Other intervertebral disc displacement, lumbar region: Secondary | ICD-10-CM

## 2021-12-06 MED ORDER — HYDROCODONE-ACETAMINOPHEN 5-325 MG PO TABS: 1.0000 | ORAL_TABLET | Freq: Four times a day (QID) | ORAL | 0 refills | Status: DC | PRN

## 2021-12-06 NOTE — Progress Notes (Signed)
The patient is a 56 year old gentleman we have seen before.  He is non-English-speaking but has family and interpreter with him.  We saw him before for right-sided low back pain with significant radicular symptoms going down his right leg.  He had tried activity modification and some therapy as well as rest.  He had been on oral steroids as well.  We did obtain an MRI eventually toward the end of last year and it did show an extraforaminal disc protrusion at L5-S1 that was likely affecting the S1 nerve root.  His symptoms are still pain in the lower aspect of his lumbar spine just to the right side but does radiate down his leg.  When we saw him in December of last year he was feeling much better so we released him to work with restrictions of lifting no greater than 40 pounds.  He said his work is still been accommodating for light duty for him but now it is several months later he is still having significant right-sided low back pain and the radicular symptoms. ? ?On my exam today he is able to get up easily from the exam table but he is obviously showing signs of discomfort in his lower back to the right side.  He still has irritation of his right side with a straight leg raise.  There is no weakness in his legs but there is some subjective numbness. ? ?At this point I would like to send him to Dr. Alvester Morin for consideration of a right-sided epidural steroid injection at L5-S1 based on the patient's pain and MRI findings.  He agrees with this treatment plan at this standpoint.  I can then see him back a few weeks after that injection.  All questions and concerns were answered and addressed.  I will send in some hydrocodone for pain. ?

## 2021-12-07 ENCOUNTER — Other Ambulatory Visit: Payer: Self-pay

## 2021-12-07 DIAGNOSIS — G8929 Other chronic pain: Secondary | ICD-10-CM

## 2021-12-20 ENCOUNTER — Encounter: Payer: Self-pay | Admitting: Orthopaedic Surgery

## 2021-12-20 ENCOUNTER — Ambulatory Visit (INDEPENDENT_AMBULATORY_CARE_PROVIDER_SITE_OTHER): Payer: Worker's Compensation | Admitting: Orthopaedic Surgery

## 2021-12-20 DIAGNOSIS — M5442 Lumbago with sciatica, left side: Secondary | ICD-10-CM | POA: Diagnosis not present

## 2021-12-20 DIAGNOSIS — G8929 Other chronic pain: Secondary | ICD-10-CM | POA: Diagnosis not present

## 2021-12-20 DIAGNOSIS — M5441 Lumbago with sciatica, right side: Secondary | ICD-10-CM

## 2021-12-20 DIAGNOSIS — M5126 Other intervertebral disc displacement, lumbar region: Secondary | ICD-10-CM

## 2021-12-20 NOTE — Progress Notes (Signed)
The patient is again well-known to me.  I just saw him 2 weeks ago.  He has been dealing with back issues since last year.  He had a work-related injury to his back.  Eventually an MRI was obtained that showed a herniated disc between L5 and S1 and he was symptomatic with sciatica and radicular symptoms going down his right leg.  At first we worked on activity modification and pain control as well as time.  Things seem to calm down so we had him on work restrictions.  When we saw him 2 weeks ago his pain had come back and he was still having significant radicular symptoms.  At this point we had recommended him for a right-sided epidural steroid injection by Dr. Alvester Morin.  That is actually scheduled for May 3.  The patient knows about this and knows to come back for that injection.  He has had no other changes in medical status.  He still has the same radicular symptoms in the same positive straight leg raise on my exam today.  He is modifying his activities and being careful how he lifts things.  He is taking hydrocodone occasionally for pain.  He will have his intervention on May 3 and then Dr. Alvester Morin can get him back to [place] about 2 weeks later.  All question concerns were answered and addressed. ?

## 2022-01-02 IMAGING — CR DG LUMBAR SPINE COMPLETE 4+V
5 series · 5 of 5 positions shown · non-contrast
Comparison: None.

CLINICAL DATA: Left back pain

EXAM:
LUMBAR SPINE - COMPLETE 4+ VIEW

[l-spine ap]
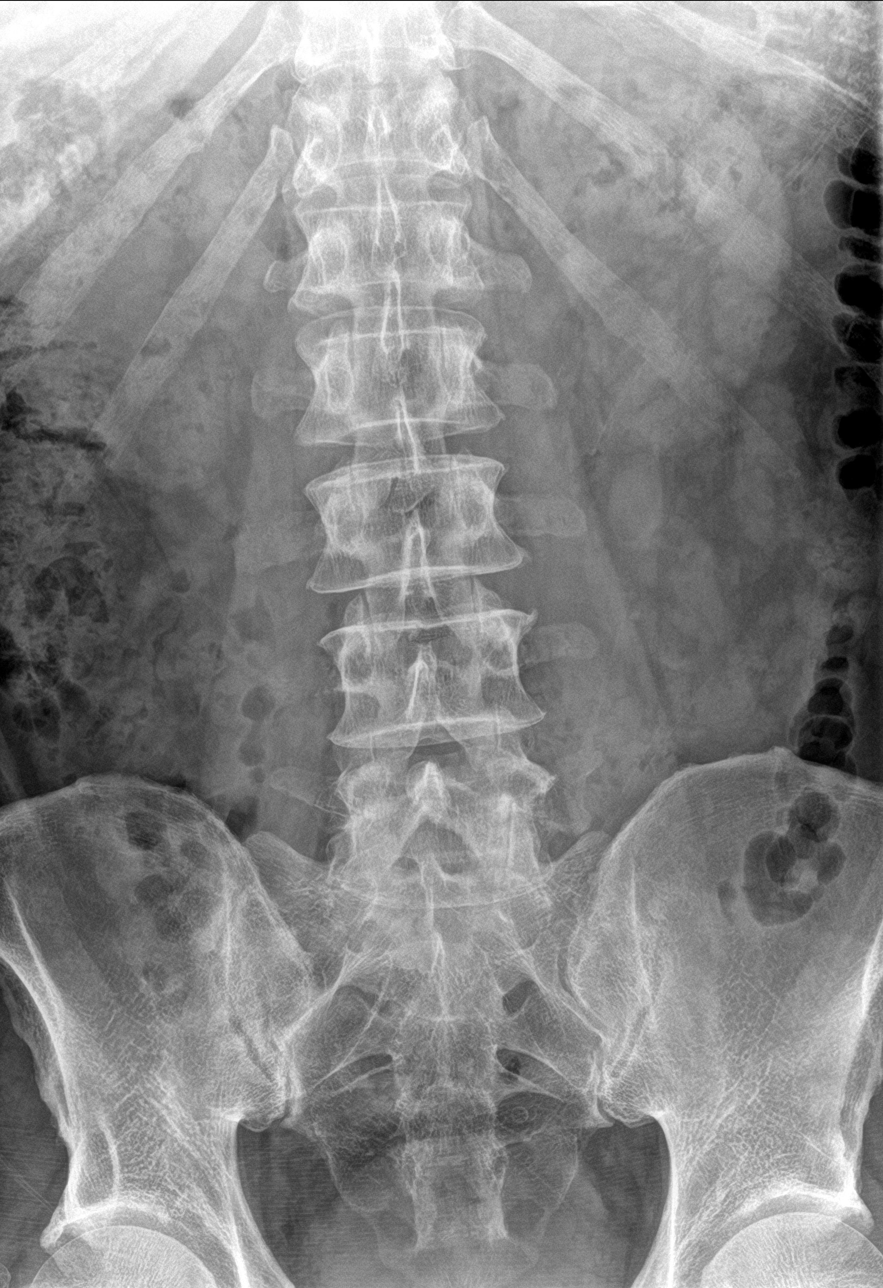

[l-spine obl (1 of 2)]
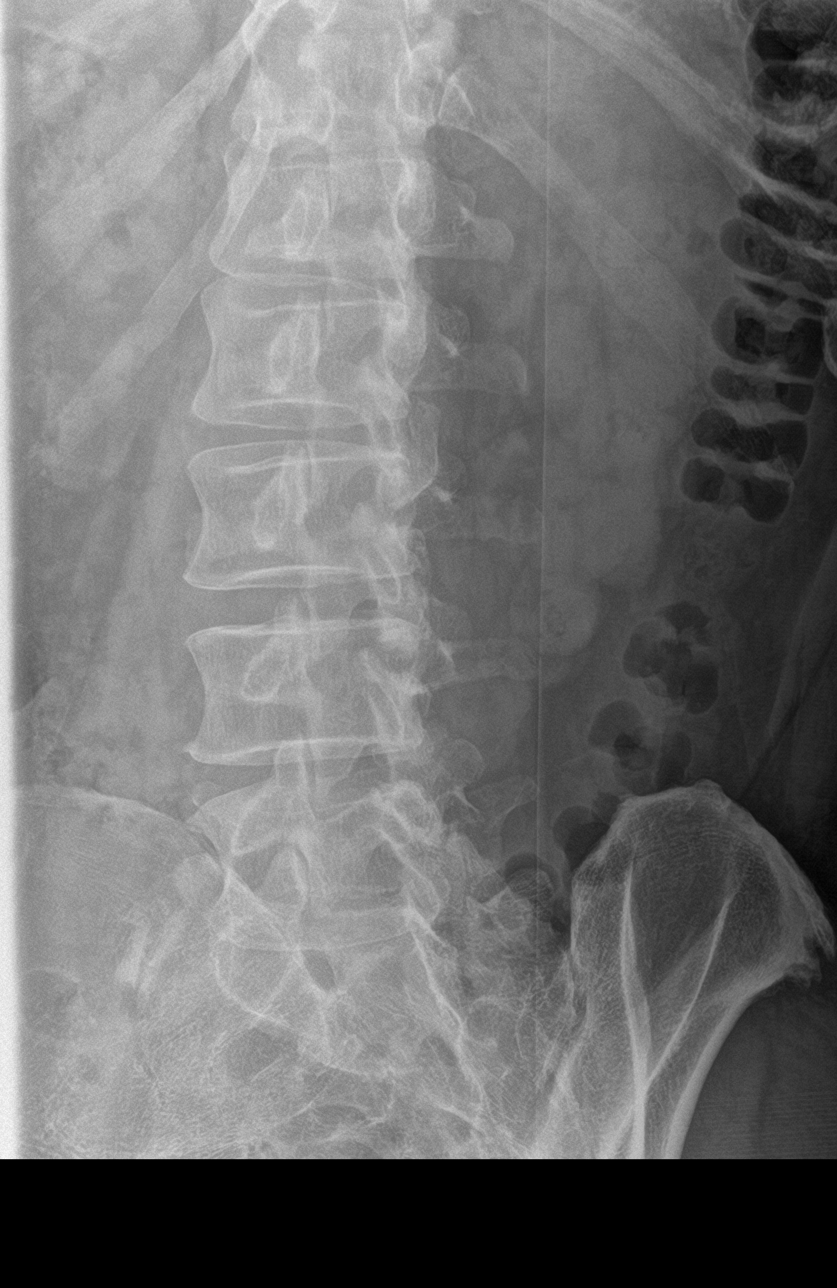

[l-spine obl (2 of 2)]
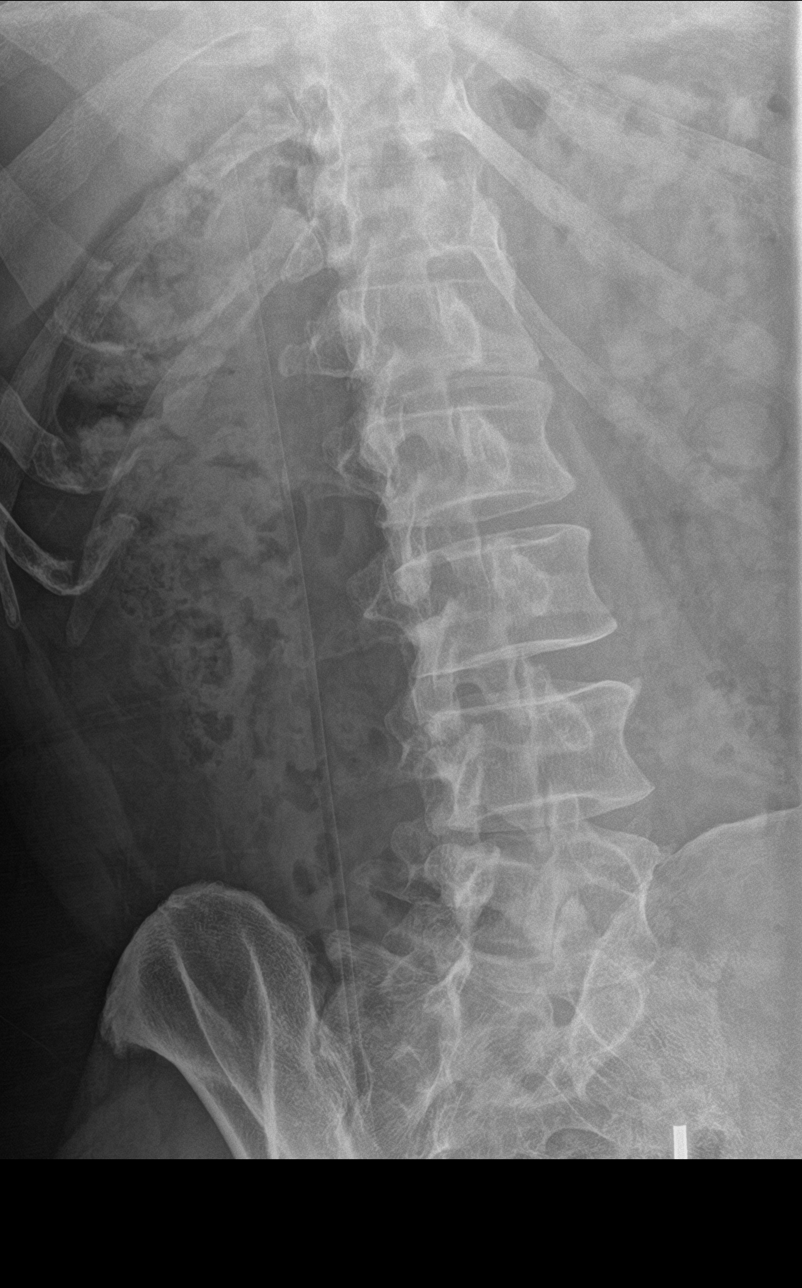

[l-spine lat]
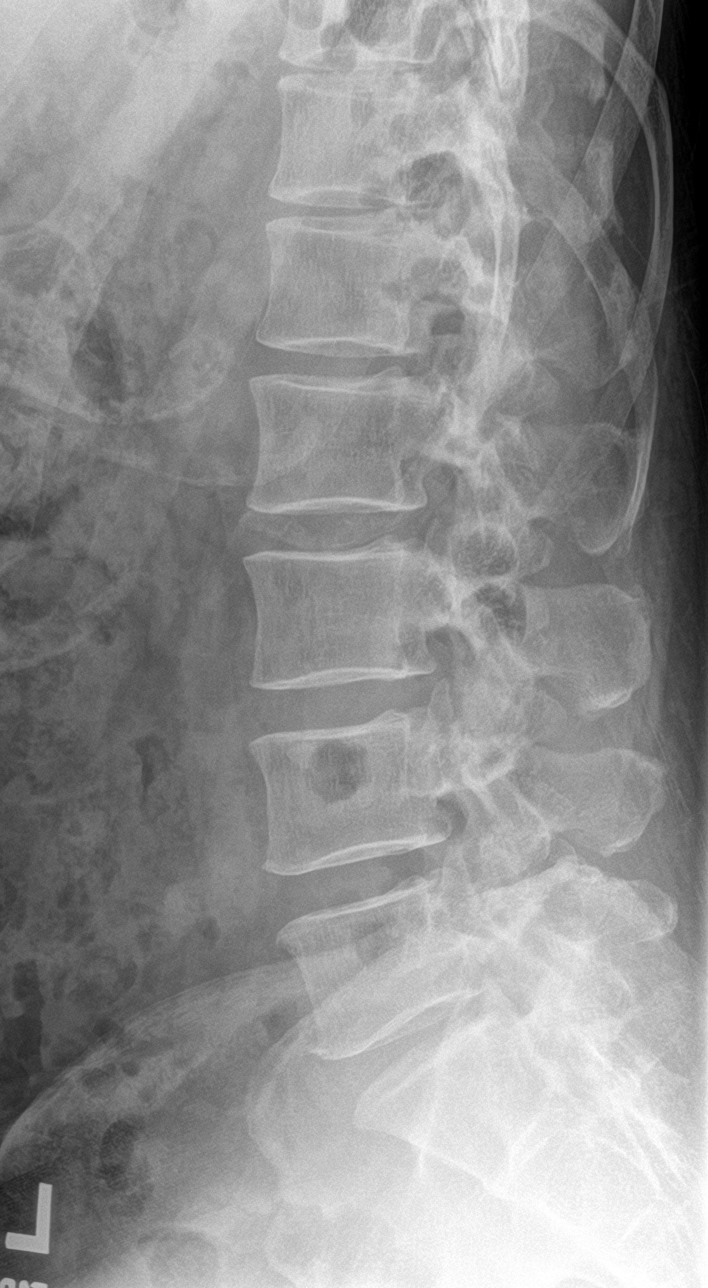

[l-spine spot]
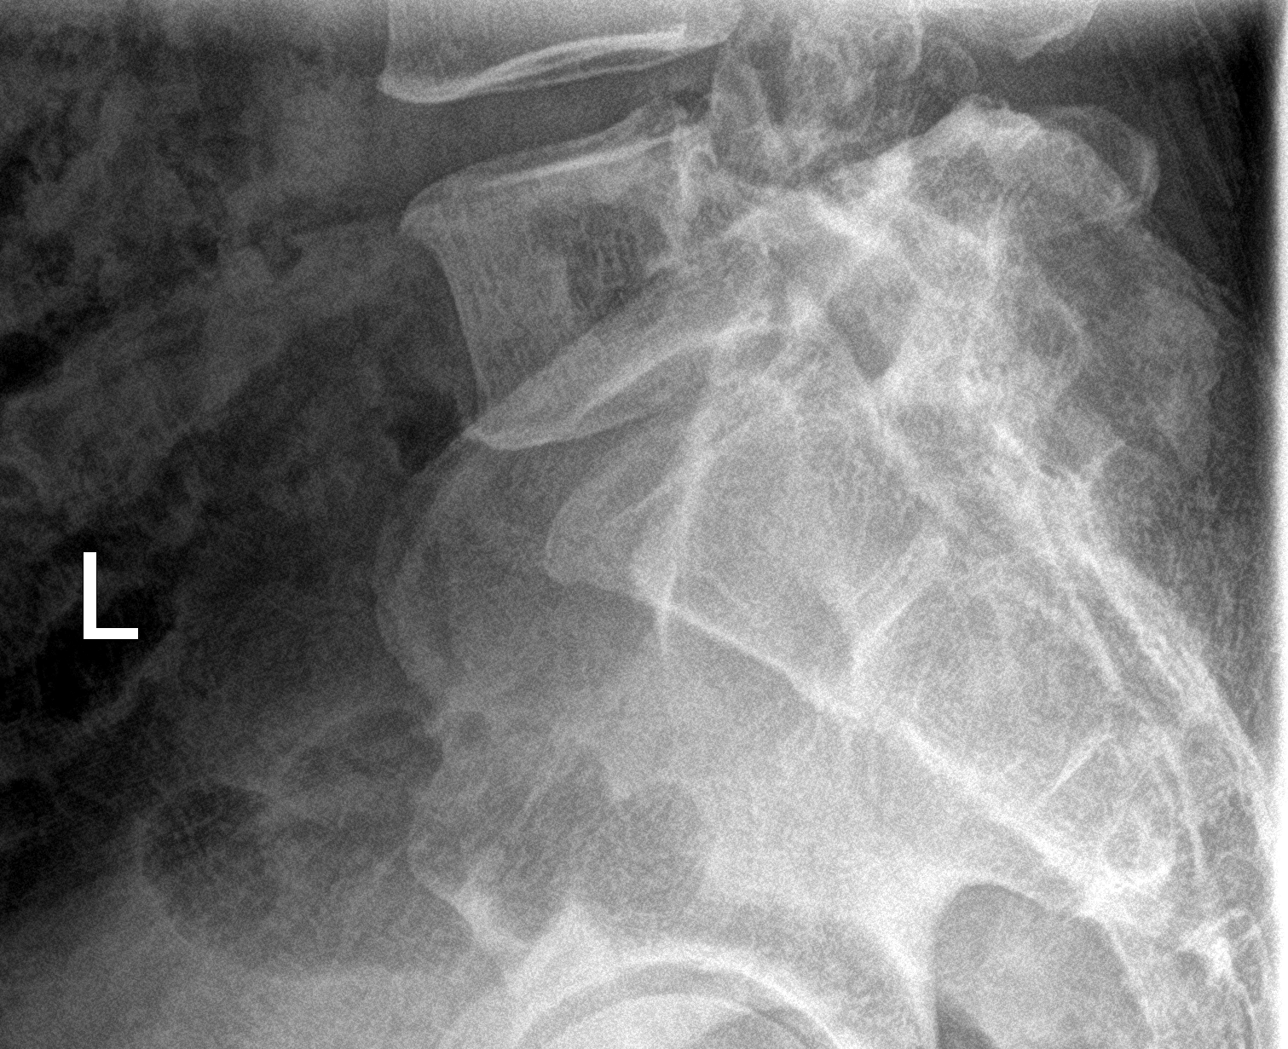

[5 of 5 positions shown; findings below may reference images not displayed]

FINDINGS: There is no evidence of lumbar spine fracture. Alignment is normal.
Intervertebral disc spaces are maintained.
IMPRESSION: Negative.

## 2022-01-09 ENCOUNTER — Encounter: Payer: Self-pay | Admitting: Physical Medicine and Rehabilitation

## 2022-01-09 ENCOUNTER — Ambulatory Visit: Payer: Self-pay

## 2022-01-09 ENCOUNTER — Ambulatory Visit (INDEPENDENT_AMBULATORY_CARE_PROVIDER_SITE_OTHER): Payer: No Typology Code available for payment source | Admitting: Physical Medicine and Rehabilitation

## 2022-01-09 VITALS — BP 128/86 | HR 84

## 2022-01-09 DIAGNOSIS — M5416 Radiculopathy, lumbar region: Secondary | ICD-10-CM

## 2022-01-09 MED ORDER — METHYLPREDNISOLONE ACETATE 80 MG/ML IJ SUSP
80.0000 mg | Freq: Once | INTRAMUSCULAR | Status: AC
  Administered 2022-01-09: 80 mg

## 2022-01-09 NOTE — Patient Instructions (Signed)

## 2022-01-09 NOTE — Progress Notes (Signed)
Pt state lower back pain that travels down his right leg. Pt state getting up from an sitting position and laying down makes the pain worse. Pt state he takes pain meds to help ease his pain. ? ?Numeric Pain Rating Scale and Functional Assessment ?Average Pain 5 ? ? ?In the last MONTH (on 0-10 scale) has pain interfered with the following? ? ?1. General activity like being  able to carry out your everyday physical activities such as walking, climbing stairs, carrying groceries, or moving a chair?  ?Rating(10) ? ? ?+Driver, -BT, -Dye Allergies. ? ?

## 2022-01-12 ENCOUNTER — Emergency Department (HOSPITAL_COMMUNITY)
Admission: EM | Admit: 2022-01-12 | Discharge: 2022-01-12 | Disposition: A | Discharge status: Home / Self Care | Payer: BC Managed Care – PPO | Attending: Emergency Medicine | Admitting: Emergency Medicine

## 2022-01-12 ENCOUNTER — Emergency Department (HOSPITAL_COMMUNITY): Payer: BC Managed Care – PPO

## 2022-01-12 ENCOUNTER — Other Ambulatory Visit: Payer: Self-pay

## 2022-01-12 ENCOUNTER — Encounter (HOSPITAL_COMMUNITY): Payer: Self-pay | Admitting: Emergency Medicine

## 2022-01-12 DIAGNOSIS — R079 Chest pain, unspecified: Secondary | ICD-10-CM | POA: Diagnosis not present

## 2022-01-12 DIAGNOSIS — R0602 Shortness of breath: Secondary | ICD-10-CM | POA: Diagnosis not present

## 2022-01-12 DIAGNOSIS — R0789 Other chest pain: Secondary | ICD-10-CM

## 2022-01-12 LAB — BASIC METABOLIC PANEL
Anion gap: 8 (ref 5–15)
BUN: 17 mg/dL (ref 6–20)
CO2: 25 mmol/L (ref 22–32)
Calcium: 9.3 mg/dL (ref 8.9–10.3)
Chloride: 106 mmol/L (ref 98–111)
Creatinine, Ser: 0.88 mg/dL (ref 0.61–1.24)
GFR, Estimated: >60 mL/min (ref >60)
Glucose, Bld: 105 mg/dL — ABNORMAL HIGH (ref 70–99)
Potassium: 4.2 mmol/L (ref 3.5–5.1)
Sodium: 139 mmol/L (ref 135–145)

## 2022-01-12 LAB — TROPONIN I (HIGH SENSITIVITY)
Troponin I (High Sensitivity): 4 ng/L (ref <18)
Troponin I (High Sensitivity): <2 ng/L (ref <18)

## 2022-01-12 LAB — CBC
HCT: 44.5 % (ref 39.0–52.0)
Hemoglobin: 15.3 g/dL (ref 13.0–17.0)
MCH: 31.6 pg (ref 26.0–34.0)
MCHC: 34.4 g/dL (ref 30.0–36.0)
MCV: 91.9 fL (ref 80.0–100.0)
Platelets: 269 10*3/uL (ref 150–400)
RBC: 4.84 MIL/uL (ref 4.22–5.81)
RDW: 12.7 % (ref 11.5–15.5)
WBC: 8.7 10*3/uL (ref 4.0–10.5)
nRBC: 0 % (ref 0.0–0.2)

## 2022-01-12 MED ORDER — IOHEXOL 350 MG/ML SOLN
100.0000 mL | Freq: Once | INTRAVENOUS | Status: AC | PRN
  Administered 2022-01-12: 100 mL via INTRAVENOUS

## 2022-01-12 MED ORDER — PREDNISONE 50 MG PO TABS: ORAL_TABLET | ORAL | 0 refills | Status: DC

## 2022-01-12 NOTE — Discharge Instructions (Addendum)
Follow up with primary care for recheck.  

## 2022-01-12 NOTE — ED Triage Notes (Signed)
C/o L sided chest pain x 1 week with intermittent numbness to L side of mouth and SOB.  Also reports redness and swelling to upper chest/ clavicle area.  Denies injury. ?

## 2022-01-13 NOTE — ED Provider Notes (Signed)
?[facility] ?Provider Note ? ? ?CSN: [id number] ?Arrival date & time: 01/12/22  [id number] ? ?  ? ?History ? ?Chief Complaint  ?Patient presents with  ? Chest Pain  ? ? ?Darryl Hull is a 56 y.o. male. ? ?Pt reports pain in left side of chest.  Pt's daughter reports the left side of pt;s chest.  Pt denies any heavy lifting.  No fever no cough  ? ?The history is provided by the patient. No language interpreter was used.  ?Chest Pain ?Pain location:  L chest ?Pain quality: aching   ?Pain radiates to:  Does not radiate ?Pain severity:  Moderate ?Onset quality:  Gradual ?Duration:  1 day ?Timing:  Constant ?Progression:  Worsening ?Chronicity:  New ?Relieved by:  Nothing ?Worsened by:  Nothing ?Ineffective treatments:  None tried ?Associated symptoms: no nausea   ?Risk factors: male sex   ? ?  ? ?Home Medications ?Prior to Admission medications   ?Medication Sig Start Date End Date Taking? Authorizing Provider  ?predniSONE (DELTASONE) 50 MG tablet One tablet a day 01/12/22  Yes Elson AreasSofia, Joei Frangos K, PA-C  ?bismuth-metronidazole-tetracycline [facility]) [phone] MG capsule Take 3 capsules by mouth 4 (four) times daily -  before meals and at bedtime. 05/09/17   Meryl DareStark, Malcolm T, MD  ?erythromycin ophthalmic ointment Place a 1/2 inch ribbon of ointment into the lower eyelid four times a day for 5 days 12/26/20   Virgina Norfolkuratolo, Adam, DO  ?HYDROcodone-acetaminophen (NORCO/VICODIN) 5-325 MG tablet Take 1 tablet by mouth every 6 (six) hours as needed for moderate pain. 12/06/21   Kathryne HitchBlackman, Christopher Y, MD  ?lidocaine (LIDODERM) 5 % Place 1 patch onto the skin daily. Remove & Discard patch within 12 hours or as directed by MD 05/07/21   Victorino DikeLoeffler, Finis BudGrace C, PA-C  ?nabumetone (RELAFEN) 500 MG tablet Take 1 tablet (500 mg total) by mouth 2 (two) times daily as needed. 05/28/21   Kathryne HitchBlackman, Christopher Y, MD  ?omeprazole (PRILOSEC) 40 MG capsule Take 1 capsule (40 mg total) by mouth daily. 05/01/17   Meryl DareStark, Malcolm  T, MD  ?promethazine (PHENERGAN) 25 MG tablet Take 1 tablet (25 mg total) by mouth every 6 (six) hours as needed for nausea or vomiting. 02/09/17   Bethann BerkshireZammit, Joseph, MD  ?ranitidine (ZANTAC) 150 MG tablet Take 1 tablet (150 mg total) by mouth 2 (two) times daily. 02/17/17   Esterwood, Amy S, PA-C  ?tiZANidine (ZANAFLEX) 4 MG tablet Take 1 tablet (4 mg total) by mouth every 8 (eight) hours as needed for muscle spasms. 05/28/21   Kathryne HitchBlackman, Christopher Y, MD  ?traMADol Janean Sark(ULTRAM) 50 MG tablet Take 1-2 tablets (50-100 mg total) by mouth every 6 (six) hours as needed. 05/28/21   Kathryne HitchBlackman, Christopher Y, MD  ?   ? ?Allergies    ?Patient has no known allergies.   ? ?Review of Systems   ?Review of Systems  ?Cardiovascular:  Positive for chest pain.  ?Gastrointestinal:  Negative for nausea.  ?All other systems reviewed and are negative. ? ?Physical Exam ?Updated Vital Signs ?BP (!) 136/93   Pulse 60   Temp 98 ?F (36.7 ?C)   Resp 12   SpO2 100%  ?Physical Exam ?Vitals and nursing note reviewed.  ?Constitutional:   ?   General: He is not in acute distress. ?   Appearance: He is well-developed.  ?HENT:  ?   Head: Normocephalic and atraumatic.  ?Eyes:  ?   Conjunctiva/sclera: Conjunctivae normal.  ?Cardiovascular:  ?   Rate and  Rhythm: Normal rate and regular rhythm.  ?   Heart sounds: Normal heart sounds. No murmur heard. ?Pulmonary:  ?   Effort: Pulmonary effort is normal. No respiratory distress.  ?   Breath sounds: Normal breath sounds.  ?Abdominal:  ?   Palpations: Abdomen is soft.  ?   Tenderness: There is no abdominal tenderness.  ?Musculoskeletal:     ?   General: No swelling.  ?   Cervical back: Neck supple.  ?Skin: ?   General: Skin is warm and dry.  ?   Capillary Refill: Capillary refill takes less than 2 seconds.  ?Neurological:  ?   Mental Status: He is alert.  ?Psychiatric:     ?   Mood and Affect: Mood normal.  ? ? ?ED Results / Procedures / Treatments   ?Labs ?(all labs ordered are listed, but only abnormal results  are displayed) ?Labs Reviewed  ?BASIC METABOLIC PANEL - Abnormal; Notable for the following components:  ?    Result Value  ? Glucose, Bld 105 (*)   ? All other components within normal limits  ?CBC  ?TROPONIN I (HIGH SENSITIVITY)  ?TROPONIN I (HIGH SENSITIVITY)  ? ? ?EKG ?EKG Interpretation ? ?Date/Time:  Saturday Jan 12 2022 09:37:09 EDT ?Ventricular Rate:  78 ?PR Interval:  160 ?QRS Duration: 84 ?QT Interval:  348 ?QTC Calculation: 396 ?R Axis:   20 ?Text Interpretation: Normal sinus rhythm Normal ECG No previous ECGs available Confirmed by Alvester Chou [phone]) on 01/12/2022 10:54:12 AM ? ?Radiology ?DG Chest 2 View ? ?Result Date: 01/12/2022 ?CLINICAL DATA:  Chest pain EXAM: CHEST - 2 VIEW COMPARISON:  None Available. FINDINGS: The heart size and mediastinal contours are within normal limits. Both lungs are clear. The visualized skeletal structures are unremarkable. IMPRESSION: No active cardiopulmonary disease. Electronically Signed   By: Allegra Lai M.D.   On: 01/12/2022 10:16  ? ?CT Angio Chest PE W and/or Wo Contrast ? ?Result Date: 01/12/2022 ?CLINICAL DATA:  Pleuritic chest pain.  Shortness of breath. EXAM: CT ANGIOGRAPHY CHEST WITH CONTRAST TECHNIQUE: Multidetector CT imaging of the chest was performed using the standard protocol during bolus administration of intravenous contrast. Multiplanar CT image reconstructions and MIPs were obtained to evaluate the vascular anatomy. RADIATION DOSE REDUCTION: This exam was performed according to the departmental dose-optimization program which includes automated exposure control, adjustment of the mA and/or kV according to patient size and/or use of iterative reconstruction technique. CONTRAST:  OMNIPAQUE IOHEXOL 350 MG/ML SOLN COMPARISON:  None Available. FINDINGS: Cardiovascular: Satisfactory opacification of pulmonary arteries noted, and no pulmonary emboli identified. No evidence of thoracic aortic dissection or aneurysm. Mediastinum/Nodes: No masses or  pathologically enlarged lymph nodes identified. Lungs/Pleura: No pulmonary mass, infiltrate, or effusion. Upper abdomen: No acute findings. Musculoskeletal: No suspicious bone lesions identified. Review of the MIP images confirms the above findings. IMPRESSION: Negative. No evidence of pulmonary embolism or other active disease. Electronically Signed   By: Danae Orleans M.D.   On: 01/12/2022 12:18   ? ?Procedures ?Procedures  ? ? ?Medications Ordered in ED ?Medications  ?iohexol (OMNIPAQUE) 350 MG/ML injection 100 mL (100 mLs Intravenous Contrast Given 01/12/22 1208)  ? ? ?ED Course/ Medical Decision Making/ A&P ?  ?                        ?Medical Decision Making ?Pt complains of pain in his left chest.  Pain worse with moving.   ? ?Problems Addressed: ?Chest wall pain:  acute illness or injury ? ?Amount and/or Complexity of Data Reviewed ?Independent Historian:  ?   Details: Pt's duaghtr here with pt and helps with history ?External Data Reviewed: notes. ?   Details: Orthopaaedic notes reviewed.  Pt has a history of back problems. ?Labs: ordered. Decision-making details documented in ED Course. ?   Details: Troponin is negatvie x 2 ?Radiology: ordered and independent interpretation performed. Decision-making details documented in ED Course. ?   Details: chest xray is normal ?Ct angio  no acute abnormaltiy ?ECG/medicine tests: ordered and independent interpretation performed. Decision-making details documented in ED Course. ?   Details: EKG  no acute abnormaltiy ? ?Risk ?Prescription drug management. ? ? ? ? ? ? ? ? ? ? ?Final Clinical Impression(s) / ED Diagnoses ?Final diagnoses:  ?Chest wall pain  ? ? ?Rx / DC Orders ?ED Discharge Orders   ? ?      Ordered  ?  predniSONE (DELTASONE) 50 MG tablet       ? 01/12/22 1411  ? ?  ?  ? ?  ? ?An After Visit Summary was printed and given to the patient. ? ?  ?Elson Areas, [place] ?01/13/22 1610 ? ?  ?Terald Sleeper, MD ?01/13/22 1339 ? ?

## 2022-01-16 NOTE — Procedures (Signed)
Lumbar Epidural Steroid Injection - Interlaminar Approach with Fluoroscopic Guidance ? ?Patient: Darryl Hull      ?Date of Birth: 1966/02/21 ?MRN: [id number] ?PCP: Patient, No Pcp Per (Inactive)      ?Visit Date: 01/09/2022 ?  ?Universal Protocol:    ? ?Consent Given By: the patient ? ?Position: PRONE ? ?Additional Comments: ?Vital signs were monitored before and after the procedure. ?Patient was prepped and draped in the usual sterile fashion. ?The correct patient, procedure, and site was verified. ? ? ?Injection Procedure Details:  ? ?Procedure diagnoses: Lumbar radiculopathy [M54.16]  ? ?Meds Administered:  ?Meds ordered this encounter  ?Medications  ? methylPREDNISolone acetate (DEPO-MEDROL) injection 80 mg  ?  ? ?Laterality: Right ? ?Location/Site:  L5-S1 ? ?Needle: 3.5 in., 20 ga. Tuohy ? ?Needle Placement: Paramedian epidural ? ?Findings:  ? -Comments: Excellent flow of contrast into the epidural space. ? ?Procedure Details: ?Using a paramedian approach from the side mentioned above, the region overlying the inferior lamina was localized under fluoroscopic visualization and the soft tissues overlying this structure were infiltrated with 4 ml. of 1% Lidocaine without Epinephrine. The Tuohy needle was inserted into the epidural space using a paramedian approach.  ? ?The epidural space was localized using loss of resistance along with counter oblique bi-planar fluoroscopic views.  After negative aspirate for air, blood, and CSF, a 2 ml. volume of Isovue-250 was injected into the epidural space and the flow of contrast was observed. Radiographs were obtained for documentation purposes.   ? ?The injectate was administered into the level noted above. ? ? ?Additional Comments:  ?The patient tolerated the procedure well ?Dressing: 2 x 2 sterile gauze and Band-Aid ?  ? ?Post-procedure details: ?Patient was observed during the procedure. ?Post-procedure instructions were reviewed. ? ?Patient left the clinic in stable  condition. ?

## 2022-01-16 NOTE — Progress Notes (Signed)
? ?[name] - 56 y.o. male MRN [id number]  Date of birth: 1966/08/31 ? ?Office Visit Note: ?Visit Date: 01/09/2022 ?PCP: Patient, No Pcp Per (Inactive) ?Referred by: Kathryne Hitch* ? ?Subjective: ?Chief Complaint  ?Patient presents with  ? Lower Back - Pain  ? Right Leg - Pain  ? ?HPI:  [name] is a 56 y.o. male who comes in today at the request of Dr. Doneen Poisson for planned Right L5-S1 Lumbar Interlaminar epidural steroid injection with fluoroscopic guidance.  The patient has failed conservative care including home exercise, medications, time and activity modification.  This injection will be diagnostic and hopefully therapeutic.  Please see requesting physician notes for further details and justification.  ? ?ROS Otherwise per HPI. ? ?Assessment & Plan: ?Visit Diagnoses:  ?  ICD-10-CM   ?1. Lumbar radiculopathy  M54.16 XR C-ARM NO REPORT  ?  Epidural Steroid injection  ?  methylPREDNISolone acetate (DEPO-MEDROL) injection 80 mg  ?  ?  ?Plan: No additional findings.  ? ?Meds & Orders:  ?Meds ordered this encounter  ?Medications  ? methylPREDNISolone acetate (DEPO-MEDROL) injection 80 mg  ?  ?Orders Placed This Encounter  ?Procedures  ? XR C-ARM NO REPORT  ? Epidural Steroid injection  ?  ?Follow-up: Return in 2 weeks (on 01/23/2022) for Doneen Poisson, MD.  ? ?Procedures: ?No procedures performed  ?Lumbar Epidural Steroid Injection - Interlaminar Approach with Fluoroscopic Guidance ? ?Patient: Darryl Hull      ?Date of Birth: 1965-10-01 ?MRN: [id number] ?PCP: Patient, No Pcp Per (Inactive)      ?Visit Date: 01/09/2022 ?  ?Universal Protocol:    ? ?Consent Given By: the patient ? ?Position: PRONE ? ?Additional Comments: ?Vital signs were monitored before and after the procedure. ?Patient was prepped and draped in the usual sterile fashion. ?The correct patient, procedure, and site was verified. ? ? ?Injection Procedure Details:  ? ?Procedure diagnoses: Lumbar radiculopathy  [M54.16]  ? ?Meds Administered:  ?Meds ordered this encounter  ?Medications  ? methylPREDNISolone acetate (DEPO-MEDROL) injection 80 mg  ?  ? ?Laterality: Right ? ?Location/Site:  L5-S1 ? ?Needle: 3.5 in., 20 ga. Tuohy ? ?Needle Placement: Paramedian epidural ? ?Findings:  ? -Comments: Excellent flow of contrast into the epidural space. ? ?Procedure Details: ?Using a paramedian approach from the side mentioned above, the region overlying the inferior lamina was localized under fluoroscopic visualization and the soft tissues overlying this structure were infiltrated with 4 ml. of 1% Lidocaine without Epinephrine. The Tuohy needle was inserted into the epidural space using a paramedian approach.  ? ?The epidural space was localized using loss of resistance along with counter oblique bi-planar fluoroscopic views.  After negative aspirate for air, blood, and CSF, a 2 ml. volume of Isovue-250 was injected into the epidural space and the flow of contrast was observed. Radiographs were obtained for documentation purposes.   ? ?The injectate was administered into the level noted above. ? ? ?Additional Comments:  ?The patient tolerated the procedure well ?Dressing: 2 x 2 sterile gauze and Band-Aid ?  ? ?Post-procedure details: ?Patient was observed during the procedure. ?Post-procedure instructions were reviewed. ? ?Patient left the clinic in stable condition.  ? ?Clinical History: ?MRI LUMBAR SPINE WITHOUT CONTRAST ? ?TECHNIQUE: ?Multiplanar, multisequence MR imaging of the lumbar spine was ?performed. No intravenous contrast was administered. ? ?COMPARISON: Radiograph from 04/30/2021. ? ?FINDINGS: ?Segmentation: Standard. Lowest well-formed disc space labeled the ?L5-S1 level. ? ?Alignment: Physiologic with preservation of the normal lumbar ?lordosis. No listhesis. ? ?  Vertebrae: Vertebral body height maintained without acute or chronic ?fracture. Bone marrow signal intensity within normal limits. No ?discrete or worrisome  osseous lesions. No abnormal marrow edema. ? ?Conus medullaris and cauda equina: Conus extends to the L2 level. ?Conus and cauda equina appear normal. ? ?Paraspinal and other soft tissues: Unremarkable. ? ?Disc levels: ? ?L1-2: Unremarkable. ? ?L2-3: Unremarkable. ? ?L3-4: Negative interspace. Mild facet hypertrophy. No significant ?canal or foraminal stenosis. ? ?L4-5: Disc desiccation with mild disc bulge. Superimposed shallow ?left foraminal to extraforaminal disc protrusion with annular ?fissure contacts the exiting left L4 nerve root (series 5, image ?39). Mild bilateral facet hypertrophy. Resultant mild narrowing of ?the lateral recesses bilaterally. Central canal remains patent. No ?significant foraminal encroachment. ? ?L5-S1: Disc desiccation. Superimposed small right subarticular disc ?protrusion contacts the descending right S1 nerve root as it courses ?through the right lateral recess (series 5, image 48). Associated ?annular fissure. Mild bilateral facet hypertrophy. Resultant mild ?narrowing of the lateral recesses bilaterally, right worse than ?left. Central canal remains patent. No significant foraminal ?encroachment. ? ?IMPRESSION: ?1. Small right subarticular disc protrusion at L5-S1, contacting and ?potentially irritating the descending right S1 nerve root. ?2. Shallow left foraminal to extraforaminal disc protrusion at L4-5, ?potentially affecting the left L4 nerve root. ?3. Mild bilateral facet hypertrophy at L3-4 through L5-S1. ? ? ?Electronically Signed ?By: Rise Mu M.D. ?On: 08/04/2021 01:53  ? ? ? ?Objective:  VS:  HT:    WT:   BMI:     BP:128/86  HR:84bpm  TEMP: ( )  RESP:  ?Physical Exam ?Vitals and nursing note reviewed.  ?Constitutional:   ?   General: He is not in acute distress. ?   Appearance: Normal appearance. He is not ill-appearing.  ?HENT:  ?   Head: Normocephalic and atraumatic.  ?   Right Ear: External ear normal.  ?   Left Ear: External ear normal.  ?   Nose:  No congestion.  ?Eyes:  ?   Extraocular Movements: Extraocular movements intact.  ?Cardiovascular:  ?   Rate and Rhythm: Normal rate.  ?   Pulses: Normal pulses.  ?Pulmonary:  ?   Effort: Pulmonary effort is normal. No respiratory distress.  ?Abdominal:  ?   General: There is no distension.  ?   Palpations: Abdomen is soft.  ?Musculoskeletal:     ?   General: No tenderness or signs of injury.  ?   Cervical back: Neck supple.  ?   Right lower leg: No edema.  ?   Left lower leg: No edema.  ?   Comments: Patient has good distal strength without clonus.  ?Skin: ?   Findings: No erythema or rash.  ?Neurological:  ?   General: No focal deficit present.  ?   Mental Status: He is alert and oriented to person, place, and time.  ?   Sensory: No sensory deficit.  ?   Motor: No weakness or abnormal muscle tone.  ?   Coordination: Coordination normal.  ?Psychiatric:     ?   Mood and Affect: Mood normal.     ?   Behavior: Behavior normal.  ?  ? ?Imaging: ?No results found. ?

## 2022-01-24 ENCOUNTER — Ambulatory Visit (INDEPENDENT_AMBULATORY_CARE_PROVIDER_SITE_OTHER): Payer: Worker's Compensation | Admitting: Orthopaedic Surgery

## 2022-01-24 ENCOUNTER — Encounter: Payer: Self-pay | Admitting: Orthopaedic Surgery

## 2022-01-24 DIAGNOSIS — M5126 Other intervertebral disc displacement, lumbar region: Secondary | ICD-10-CM | POA: Diagnosis not present

## 2022-01-24 DIAGNOSIS — M5441 Lumbago with sciatica, right side: Secondary | ICD-10-CM

## 2022-01-24 DIAGNOSIS — M5416 Radiculopathy, lumbar region: Secondary | ICD-10-CM

## 2022-01-24 DIAGNOSIS — G8929 Other chronic pain: Secondary | ICD-10-CM

## 2022-01-24 MED ORDER — IBUPROFEN 800 MG PO TABS
800.0000 mg | ORAL_TABLET | Freq: Three times a day (TID) | ORAL | 3 refills | Status: DC | PRN
Start: 2022-01-24 — End: 2023-05-21

## 2022-01-24 NOTE — Progress Notes (Signed)
The patient is return for follow-up about 2 weeks after having a right-sided epidural steroid injection by Dr. Ernestina Patches at L5-S1.  He reports that he is doing much better since the injections.  He is still working and on work restrictions and he said his work is very compliant with that.  He is requesting prescription dose ibuprofen.  He does not speak [place] but has someone interpreting for him.  On exam today he does have less pain with flexion extension of his lumbar spine.  He is still tight with flexion and his hamstrings are tight but overall he looks better.  There is negative straight leg raise on the right side and he has no significant radicular components of his pain today.  I will send in 800 mg ibuprofen to take as needed.  He is on permanent job restriction in terms of lifting.  Follow-up is as needed.  I would certainly recommend a repeat epidural steroid injection to the right at L5-S1 if he does have a flareup of pain again prior to considering any type of surgical intervention or referral.

## 2022-02-22 ENCOUNTER — Encounter: Payer: Self-pay | Admitting: Gastroenterology

## 2022-03-25 ENCOUNTER — Ambulatory Visit: Payer: BC Managed Care – PPO | Admitting: Orthopaedic Surgery

## 2022-04-11 ENCOUNTER — Ambulatory Visit (INDEPENDENT_AMBULATORY_CARE_PROVIDER_SITE_OTHER): Payer: Worker's Compensation | Admitting: Orthopaedic Surgery

## 2022-04-11 DIAGNOSIS — M5441 Lumbago with sciatica, right side: Secondary | ICD-10-CM

## 2022-04-11 DIAGNOSIS — M5416 Radiculopathy, lumbar region: Secondary | ICD-10-CM

## 2022-04-11 DIAGNOSIS — M5126 Other intervertebral disc displacement, lumbar region: Secondary | ICD-10-CM | POA: Diagnosis not present

## 2022-04-11 DIAGNOSIS — G8929 Other chronic pain: Secondary | ICD-10-CM

## 2022-04-11 MED ORDER — BACLOFEN 10 MG PO TABS: 10.0000 mg | ORAL_TABLET | Freq: Three times a day (TID) | ORAL | 1 refills | Status: DC | PRN

## 2022-04-11 NOTE — Progress Notes (Signed)
The patient is still not seen before.  Back in early May he had a right-sided epidural steroid injection at L5-S1 due to a chronic disc herniation.  He is back at work but still having problems with stiffness and back pain at the end of the day especially when he works hard.  Occasionally still gets right-sided radicular symptoms going down the right leg.  His daughter is with him who interprets.  He denies any change in bowel or bladder function or weakness in his legs.  Most of his pain seems to be in the right side of his lower back on my exam today as well.  He has negative straight leg raise.  I would like to send him to outpatient physical therapy for any modalities that they can try to help decrease his right-sided low back pain.  I will also try baclofen as a muscle relaxant for him.  They agree with this treatment plan.  We will see him back in 4 weeks to see how he is responding to the medication and therapy.  My next step would be sending him back to Dr. Alvester Morin for another injection.

## 2022-04-12 ENCOUNTER — Other Ambulatory Visit: Payer: Self-pay

## 2022-04-12 DIAGNOSIS — M5416 Radiculopathy, lumbar region: Secondary | ICD-10-CM

## 2022-05-09 ENCOUNTER — Ambulatory Visit (INDEPENDENT_AMBULATORY_CARE_PROVIDER_SITE_OTHER): Payer: No Typology Code available for payment source | Admitting: Orthopaedic Surgery

## 2022-05-09 ENCOUNTER — Encounter: Payer: Self-pay | Admitting: Orthopaedic Surgery

## 2022-05-09 DIAGNOSIS — M5416 Radiculopathy, lumbar region: Secondary | ICD-10-CM

## 2022-05-09 MED ORDER — DICLOFENAC SODIUM 75 MG PO TBEC: 75.0000 mg | DELAYED_RELEASE_TABLET | Freq: Two times a day (BID) | ORAL | 2 refills | Status: DC | PRN

## 2022-05-09 MED ORDER — GABAPENTIN 100 MG PO CAPS: 100.0000 mg | ORAL_CAPSULE | Freq: Three times a day (TID) | ORAL | 1 refills | Status: DC | PRN

## 2022-05-09 NOTE — Progress Notes (Signed)
The patient is well-known to me.  He has chronic right-sided low back pain with sciatica as a relates to a work-related injury and herniated disc.  He has had injections in the past and had done well but then his pain came back.  I saw him 4 weeks ago and set him up for outpatient physical therapy but there was some communication issues and therapy does not start until Tuesday of next week.  He is now having some pain going across his upper back and shoulders and in the joints of his hands.  He is a hard-working 56 year old gentleman.  He is continue to go to work through all this.  I have him on baclofen as an anti-inflammatory and he said that has helped just a little bit.  He still has good mobility of his upper and lower spine just aching and stiffness.  He would definitely benefit from outpatient physical therapy for strengthening his core and his upper and lower extremities as well as his upper and lower back with any modalities per the therapist discretion.  The focus should be on the upper and lower back.  I will send in some diclofenac to try as an anti-inflammatory and some Neurontin to take intermittently for pain.  We will reevaluate in 4 weeks.  I gave him a note to give to work that explains what we are recommending for him.

## 2022-05-22 ENCOUNTER — Encounter: Payer: Self-pay | Admitting: Gastroenterology

## 2022-06-13 ENCOUNTER — Encounter: Payer: Self-pay | Admitting: Orthopaedic Surgery

## 2022-06-13 ENCOUNTER — Ambulatory Visit (INDEPENDENT_AMBULATORY_CARE_PROVIDER_SITE_OTHER): Payer: BC Managed Care – PPO | Admitting: Orthopaedic Surgery

## 2022-06-13 DIAGNOSIS — G8929 Other chronic pain: Secondary | ICD-10-CM | POA: Diagnosis not present

## 2022-06-13 DIAGNOSIS — M5126 Other intervertebral disc displacement, lumbar region: Secondary | ICD-10-CM | POA: Diagnosis not present

## 2022-06-13 DIAGNOSIS — M5441 Lumbago with sciatica, right side: Secondary | ICD-10-CM | POA: Diagnosis not present

## 2022-06-13 DIAGNOSIS — M5416 Radiculopathy, lumbar region: Secondary | ICD-10-CM

## 2022-06-13 NOTE — Progress Notes (Signed)
The patient is a active 56 year old gentleman that we have been following for some time now as a relates to a work-related injury that occurred to his lumbar spine.  He sustained a herniation of the lower lumbar spine to the right side.  He is in physical therapy now and he says it has been very helpful for his back but also tenseness and tension that he gets in his shoulders.  We have had him on diclofenac as well as occasional baclofen and Neurontin.  He only takes the Neurontin at nighttime because it does make him sleepy feeling.  He says the therapy has been very helpful to him and is becoming more mobile with less pain.  We had him on a work restriction of lifting no greater than 25 pounds.  He thinks that this can be increased to 45 pounds at this standpoint.  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  He has excellent strength in his bilateral lower extremities.  There is still some tightness in the paraspinal muscles of the shoulders into the right side of his lumbar spine but he is mobilizing better and overall he does not look better.  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  He will continue therapy for least another 4 weeks and I will see him back at that point for potentially a final visit.  All questions and concerns were answered and addressed through an interpreter.

## 2022-07-17 ENCOUNTER — Encounter: Payer: Self-pay | Admitting: Orthopaedic Surgery

## 2022-07-17 ENCOUNTER — Ambulatory Visit (INDEPENDENT_AMBULATORY_CARE_PROVIDER_SITE_OTHER): Payer: No Typology Code available for payment source | Admitting: Orthopaedic Surgery

## 2022-07-17 DIAGNOSIS — M5416 Radiculopathy, lumbar region: Secondary | ICD-10-CM | POA: Diagnosis not present

## 2022-07-17 DIAGNOSIS — M5126 Other intervertebral disc displacement, lumbar region: Secondary | ICD-10-CM | POA: Diagnosis not present

## 2022-07-17 MED ORDER — TRAMADOL HCL 50 MG PO TABS: 50.0000 mg | ORAL_TABLET | Freq: Two times a day (BID) | ORAL | 0 refills | Status: DC | PRN

## 2022-07-17 MED ORDER — DICLOFENAC SODIUM 75 MG PO TBEC: 75.0000 mg | DELAYED_RELEASE_TABLET | Freq: Two times a day (BID) | ORAL | 2 refills | Status: DC | PRN

## 2022-07-17 NOTE — Progress Notes (Signed)
The patient continues to follow-up as a relates to a work-related injury to his lumbar spine to the right side affecting the L5/S1 disc space.  He did sustain a herniated disc in this area.  Over time he has had physical therapy and an epidural steroid injection.  He is worked on activity modification and a home exercise program.  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  His restrictions are lifting no greater than 45 pounds and a moderate physical demand level of work.  He has 1 more physical therapy session coming up on 16 November.  I feel like it is worth him continuing that therapy.  He is requesting a refill of anti-inflammatory medications.  He is feeling much better overall.  On physical exam, he has a negative straight leg raise bilaterally and excellent strength in his bilateral lower extremities.  He has just a little bit of low back pain to the right side.  Of note his bilateral shoulder stiffness and neck pain have improved with therapy and time.  From my standpoint I will release him from our services.  However, if he develops any worsening pain in his lumbar spine and radicular symptoms going on the right side that he would need to seek treatment from [place].  All questions and concerns were answered and addressed.

## 2022-08-26 ENCOUNTER — Encounter: Payer: Self-pay | Admitting: Physician Assistant

## 2022-08-26 ENCOUNTER — Ambulatory Visit (INDEPENDENT_AMBULATORY_CARE_PROVIDER_SITE_OTHER): Payer: No Typology Code available for payment source | Admitting: Physician Assistant

## 2022-08-26 DIAGNOSIS — M5416 Radiculopathy, lumbar region: Secondary | ICD-10-CM | POA: Diagnosis not present

## 2022-08-27 NOTE — Progress Notes (Signed)
HPI: Darryl Hull returns today due to work related injury to his lumbar spine.  He did undergo epidural steroid injection with Dr. Alvester Morin in May of this year it was a right sided L5-S1 paramedian injection.  Patient states with that therapy did well until just recently.  He is now having constant pain that radiates down his right leg.  He states is similar to what he was having before but not as severe pain.  He denies any bowel or bladder dysfunction saddle anesthesia fevers or chills.  Does note waking pain.  Pain is worse in the afternoon.  Pain is worse with lying down.  MRI on his back previously showed a right L5-S1 lumbar small subarticular disc protrusion felt to contact the right S1 descending nerve root.  Review of systems: See HPI otherwise negative  Physical exam: No acute distress mood and affect appropriate. Vascular dorsal pedal pulses are 2+ and equal symmetric bilaterally.  Calf supple nontender.  Lower extremities he has 5 out of 5 strength throughout the lower extremities exhibits resistance except for right great toe which she has 4 out of 5 resistance with flexion of the great toe.  Sensation intact bilateral feet.  Positive straight leg raise on the right negative on the left.  Impression: Right lumbar radicular pain  Plan: Will have him undergo repeat epidural steroid injection with Dr. Alvester Morin most likely right L5-S1 paramedian injection.  See him back after the injection to see what type of response he had to the injection.  An interpreter was required today as patient does speak [place].

## 2022-08-28 NOTE — Addendum Note (Signed)
Addended by: Barbette Or on: 08/28/2022 08:33 AM   Modules accepted: Orders

## 2022-08-30 ENCOUNTER — Telehealth: Payer: Self-pay | Admitting: Physical Medicine and Rehabilitation

## 2022-08-30 NOTE — Telephone Encounter (Signed)
Pt returned call [facility] [place] to set an appt. Please call pt at [phone]

## 2022-09-04 ENCOUNTER — Telehealth: Payer: Self-pay | Admitting: Physical Medicine and Rehabilitation

## 2022-09-04 NOTE — Telephone Encounter (Signed)
Pt's daughter Trena Platt called to set an back injection appt for pt. Please call at [phone]

## 2022-09-24 ENCOUNTER — Ambulatory Visit: Payer: Self-pay

## 2022-09-24 ENCOUNTER — Ambulatory Visit (INDEPENDENT_AMBULATORY_CARE_PROVIDER_SITE_OTHER): Payer: No Typology Code available for payment source | Admitting: Physical Medicine and Rehabilitation

## 2022-09-24 VITALS — BP 141/89 | HR 76

## 2022-09-24 DIAGNOSIS — M5416 Radiculopathy, lumbar region: Secondary | ICD-10-CM

## 2022-09-24 MED ORDER — METHYLPREDNISOLONE ACETATE 80 MG/ML IJ SUSP
80.0000 mg | Freq: Once | INTRAMUSCULAR | Status: AC
  Administered 2022-09-24: 80 mg

## 2022-09-24 NOTE — Patient Instructions (Signed)

## 2022-09-24 NOTE — Progress Notes (Signed)
Functional Pain Scale - descriptive words and definitions  Unmanageable (7)  Pain interferes with normal ADL's/nothing seems to help/sleep is very difficult/active distractions are very difficult to concentrate on. Severe range order  Average Pain 7   +Driver, -BT, -Dye Allergies.  

## 2022-10-07 NOTE — Procedures (Signed)
Lumbar Epidural Steroid Injection - Interlaminar Approach with Fluoroscopic Guidance  Patient: Darryl Hull      Date of Birth: 06-Jul-1966 MRN: [id number] PCP: Patient, No Pcp Per      Visit Date: 09/24/2022   Universal Protocol:     Consent Given By: the patient  Position: PRONE  Additional Comments: Vital signs were monitored before and after the procedure. Patient was prepped and draped in the usual sterile fashion. The correct patient, procedure, and site was verified.   Injection Procedure Details:   Procedure diagnoses: Lumbar radiculopathy [M54.16]   Meds Administered:  Meds ordered this encounter  Medications   methylPREDNISolone acetate (DEPO-MEDROL) injection 80 mg     Laterality: Right  Location/Site:  L5-S1  Needle: 3.5 in., 20 ga. Tuohy  Needle Placement: Paramedian epidural  Findings:   -Comments: Excellent flow of contrast into the epidural space.  Procedure Details: Using a paramedian approach from the side mentioned above, the region overlying the inferior lamina was localized under fluoroscopic visualization and the soft tissues overlying this structure were infiltrated with 4 ml. of 1% Lidocaine without Epinephrine. The Tuohy needle was inserted into the epidural space using a paramedian approach.   The epidural space was localized using loss of resistance along with counter oblique bi-planar fluoroscopic views.  After negative aspirate for air, blood, and CSF, a 2 ml. volume of Isovue-250 was injected into the epidural space and the flow of contrast was observed. Radiographs were obtained for documentation purposes.    The injectate was administered into the level noted above.   Additional Comments:  The patient tolerated the procedure well Dressing: 2 x 2 sterile gauze and Band-Aid    Post-procedure details: Patient was observed during the procedure. Post-procedure instructions were reviewed.  Patient left the clinic in stable  condition.

## 2022-10-07 NOTE — Progress Notes (Signed)
Darryl Hull - 57 y.o. male MRN [id number]  Date of birth: 1966/01/22  Office Visit Note: Visit Date: 09/24/2022 PCP: Patient, No Pcp Per Referred by: Pete Pelt, PA-C  Subjective: Chief Complaint  Patient presents with   Lower Back - Pain   HPI:  Darryl Hull is a 57 y.o. male who comes in today at the request of Dr. Jean Rosenthal and Benita Stabile, PA-C for planned Right L5-S1 Lumbar Interlaminar epidural steroid injection with fluoroscopic guidance.  The patient has failed conservative care including home exercise, medications, time and activity modification.  This injection will be diagnostic and hopefully therapeutic.  Please see requesting physician notes for further details and justification.   ROS Otherwise per HPI.  Assessment & Plan: Visit Diagnoses:    ICD-10-CM   1. Lumbar radiculopathy  M54.16 XR C-ARM NO REPORT    Epidural Steroid injection    methylPREDNISolone acetate (DEPO-MEDROL) injection 80 mg      Plan: No additional findings.   Meds & Orders:  Meds ordered this encounter  Medications   methylPREDNISolone acetate (DEPO-MEDROL) injection 80 mg    Orders Placed This Encounter  Procedures   XR C-ARM NO REPORT   Epidural Steroid injection    Follow-up: Return for visit to requesting provider as needed.   Procedures: No procedures performed  Lumbar Epidural Steroid Injection - Interlaminar Approach with Fluoroscopic Guidance  Patient: Darryl Hull      Date of Birth: 01-10-1966 MRN: [id number] PCP: Patient, No Pcp Per      Visit Date: 09/24/2022   Universal Protocol:     Consent Given By: the patient  Position: PRONE  Additional Comments: Vital signs were monitored before and after the procedure. Patient was prepped and draped in the usual sterile fashion. The correct patient, procedure, and site was verified.   Injection Procedure Details:   Procedure diagnoses: Lumbar radiculopathy [M54.16]   Meds Administered:  Meds  ordered this encounter  Medications   methylPREDNISolone acetate (DEPO-MEDROL) injection 80 mg     Laterality: Right  Location/Site:  L5-S1  Needle: 3.5 in., 20 ga. Tuohy  Needle Placement: Paramedian epidural  Findings:   -Comments: Excellent flow of contrast into the epidural space.  Procedure Details: Using a paramedian approach from the side mentioned above, the region overlying the inferior lamina was localized under fluoroscopic visualization and the soft tissues overlying this structure were infiltrated with 4 ml. of 1% Lidocaine without Epinephrine. The Tuohy needle was inserted into the epidural space using a paramedian approach.   The epidural space was localized using loss of resistance along with counter oblique bi-planar fluoroscopic views.  After negative aspirate for air, blood, and CSF, a 2 ml. volume of Isovue-250 was injected into the epidural space and the flow of contrast was observed. Radiographs were obtained for documentation purposes.    The injectate was administered into the level noted above.   Additional Comments:  The patient tolerated the procedure well Dressing: 2 x 2 sterile gauze and Band-Aid    Post-procedure details: Patient was observed during the procedure. Post-procedure instructions were reviewed.  Patient left the clinic in stable condition.   Clinical History: MRI LUMBAR SPINE WITHOUT CONTRAST  TECHNIQUE: Multiplanar, multisequence MR imaging of the lumbar spine was performed. No intravenous contrast was administered.  COMPARISON: Radiograph from 04/30/2021.  FINDINGS: Segmentation: Standard. Lowest well-formed disc space labeled the L5-S1 level.  Alignment: Physiologic with preservation of the normal lumbar lordosis. No listhesis.  Vertebrae: Vertebral body height maintained  without acute or chronic fracture. Bone marrow signal intensity within normal limits. No discrete or worrisome osseous lesions. No abnormal marrow  edema.  Conus medullaris and cauda equina: Conus extends to the L2 level. Conus and cauda equina appear normal.  Paraspinal and other soft tissues: Unremarkable.  Disc levels:  L1-2: Unremarkable.  L2-3: Unremarkable.  L3-4: Negative interspace. Mild facet hypertrophy. No significant canal or foraminal stenosis.  L4-5: Disc desiccation with mild disc bulge. Superimposed shallow left foraminal to extraforaminal disc protrusion with annular fissure contacts the exiting left L4 nerve root (series 5, image 39). Mild bilateral facet hypertrophy. Resultant mild narrowing of the lateral recesses bilaterally. Central canal remains patent. No significant foraminal encroachment.  L5-S1: Disc desiccation. Superimposed small right subarticular disc protrusion contacts the descending right S1 nerve root as it courses through the right lateral recess (series 5, image 48). Associated annular fissure. Mild bilateral facet hypertrophy. Resultant mild narrowing of the lateral recesses bilaterally, right worse than left. Central canal remains patent. No significant foraminal encroachment.  IMPRESSION: 1. Small right subarticular disc protrusion at L5-S1, contacting and potentially irritating the descending right S1 nerve root. 2. Shallow left foraminal to extraforaminal disc protrusion at L4-5, potentially affecting the left L4 nerve root. 3. Mild bilateral facet hypertrophy at L3-4 through L5-S1.   Electronically Signed By: Jeannine Boga M.D. On: 08/04/2021 01:53     Objective:  VS:  HT:    WT:   BMI:     BP:(!) 141/89  HR:76bpm  TEMP: ( )  RESP:  Physical Exam Vitals and nursing note reviewed.  Constitutional:      General: He is not in acute distress.    Appearance: Normal appearance. He is not ill-appearing.  HENT:     Head: Normocephalic and atraumatic.     Right Ear: External ear normal.     Left Ear: External ear normal.     Nose: No congestion.  Eyes:      Extraocular Movements: Extraocular movements intact.  Cardiovascular:     Rate and Rhythm: Normal rate.     Pulses: Normal pulses.  Pulmonary:     Effort: Pulmonary effort is normal. No respiratory distress.  Abdominal:     General: There is no distension.     Palpations: Abdomen is soft.  Musculoskeletal:        General: No tenderness or signs of injury.     Cervical back: Neck supple.     Right lower leg: No edema.     Left lower leg: No edema.     Comments: Patient has good distal strength without clonus.  Skin:    Findings: No erythema or rash.  Neurological:     General: No focal deficit present.     Mental Status: He is alert and oriented to person, place, and time.     Sensory: No sensory deficit.     Motor: No weakness or abnormal muscle tone.     Coordination: Coordination normal.  Psychiatric:        Mood and Affect: Mood normal.        Behavior: Behavior normal.      Imaging: No results found.

## 2022-11-14 ENCOUNTER — Encounter: Payer: Self-pay | Admitting: Radiology

## 2023-01-23 ENCOUNTER — Encounter: Payer: Self-pay | Admitting: Nurse Practitioner

## 2023-01-23 ENCOUNTER — Ambulatory Visit: Payer: BC Managed Care – PPO | Admitting: Nurse Practitioner

## 2023-01-23 VITALS — BP 110/80 | HR 85 | Temp 98.8°F | Ht 74.0 in | Wt 221.0 lb

## 2023-01-23 DIAGNOSIS — Z2821 Immunization not carried out because of patient refusal: Secondary | ICD-10-CM | POA: Diagnosis not present

## 2023-01-23 DIAGNOSIS — M25541 Pain in joints of right hand: Secondary | ICD-10-CM | POA: Diagnosis not present

## 2023-01-23 DIAGNOSIS — H269 Unspecified cataract: Secondary | ICD-10-CM

## 2023-01-23 DIAGNOSIS — Z800 Family history of malignant neoplasm of digestive organs: Secondary | ICD-10-CM

## 2023-01-23 DIAGNOSIS — Z1211 Encounter for screening for malignant neoplasm of colon: Secondary | ICD-10-CM

## 2023-01-23 DIAGNOSIS — Z7689 Persons encountering health services in other specified circumstances: Secondary | ICD-10-CM | POA: Insufficient documentation

## 2023-01-23 DIAGNOSIS — M25542 Pain in joints of left hand: Secondary | ICD-10-CM

## 2023-01-23 DIAGNOSIS — E559 Vitamin D deficiency, unspecified: Secondary | ICD-10-CM | POA: Diagnosis not present

## 2023-01-23 MED ORDER — PREDNISONE 10 MG (21) PO TBPK: ORAL_TABLET | ORAL | 0 refills | Status: DC

## 2023-01-23 NOTE — Patient Instructions (Addendum)
Go to [facility] at [employer] [address] [employer] for xrays you do not need an appt.   Do not start steroid until after getting xray.   You can take tylenol 500mg  - 1000 mg 1-2 times a day regularly to help with the pain. This is safe as long as you do not take more than 4,000 mg a day.

## 2023-01-23 NOTE — Progress Notes (Signed)
[name],acting as a Neurosurgeon for Darryl Felts, FNP.,have documented all relevant documentation on the behalf of Darryl Felts, FNP,as directed by  Darryl Felts, FNP while in the presence of Darryl Felts, FNP.    Subjective:     Patient ID: Darryl Hull , male    DOB: [birth year] , 57 y.o.   MRN: [id number]   Chief Complaint  Patient presents with   Establish Care    HPI  Patient presents today to establish care. He is currently working in a junk yard. Married. 6 children all are healthy. He is originally from the [place] and has been in [place] for 29 years.   Patient reports his joints in his hands hurt and are always swollen, for about 3 years but it is getting worse. He was living in [place] prior to relocating here. He was told to take ibuprofen the last time he talked with a provider. He has some relief but does not "fix" the problem. His grandmother had arthritis but not sure which type. Pain is worse when touch. Does not improve at all other than when taking ibuprofen. When working it fatigues him. At work he is not supposed to lift more than 45 lbs.   Patient reports a work accident that has left him with sactica pain and lower lumbar pain about 1-2 years ago. He was not able to work for 3 months. He had PT. Since having the injection has reduced and he now some relief and in order to get more better he would have to have surgery and they are still thinking about it.   Patient presents with his daughter.  BP Readings from Last 3 Encounters: 01/23/23 : 110/80 09/24/22 : (!) 141/89 01/12/22 : (!) 136/93  Interpreter Coy Saunas is present during visit.    History reviewed. No pertinent past medical history.   Family History  Problem Relation Age of Onset   Colon cancer Father 54     Current Outpatient Medications:    baclofen (LIORESAL) 10 MG tablet, Take 1 tablet (10 mg total) by mouth 3 (three) times daily as needed for muscle spasms., Disp: 60 each, Rfl:  1   bismuth-metronidazole-tetracycline (PYLERA) 140-125-125 MG capsule, Take 3 capsules by mouth 4 (four) times daily -  before meals and at bedtime., Disp: 168 capsule, Rfl: 0   diclofenac (VOLTAREN) 75 MG EC tablet, Take 1 tablet (75 mg total) by mouth 2 (two) times daily between meals as needed., Disp: 60 tablet, Rfl: 2   erythromycin ophthalmic ointment, Place a 1/2 inch ribbon of ointment into the lower eyelid four times a day for 5 days, Disp: 3.5 g, Rfl: 0   gabapentin (NEURONTIN) 100 MG capsule, Take 1 capsule (100 mg total) by mouth 3 (three) times daily as needed., Disp: 60 capsule, Rfl: 1   HYDROcodone-acetaminophen (NORCO/VICODIN) 5-325 MG tablet, Take 1 tablet by mouth every 6 (six) hours as needed for moderate pain., Disp: 30 tablet, Rfl: 0   ibuprofen (ADVIL) 800 MG tablet, Take 1 tablet (800 mg total) by mouth every 8 (eight) hours as needed., Disp: 60 tablet, Rfl: 3   lidocaine (LIDODERM) 5 %, Place 1 patch onto the skin daily. Remove & Discard patch within 12 hours or as directed by MD, Disp: 10 patch, Rfl: 0   predniSONE (STERAPRED UNI-PAK 21 TAB) 10 MG (21) TBPK tablet, Take as directed, Disp: 21 tablet, Rfl: 0   promethazine (PHENERGAN) 25 MG tablet, Take 1 tablet (25 mg total) by mouth every  6 (six) hours as needed for nausea or vomiting., Disp: 15 tablet, Rfl: 0   ranitidine (ZANTAC) 150 MG tablet, Take 1 tablet (150 mg total) by mouth 2 (two) times daily., Disp: 60 tablet, Rfl: 2   traMADol (ULTRAM) 50 MG tablet, Take 1-2 tablets (50-100 mg total) by mouth every 12 (twelve) hours as needed., Disp: 40 tablet, Rfl: 0   No Known Allergies   Review of Systems  Constitutional: Negative.   Eyes: Negative.   Respiratory: Negative.    Cardiovascular: Negative.   Gastrointestinal: Negative.   Endocrine: Negative.   Genitourinary: Negative.   Musculoskeletal: Negative.   Allergic/Immunologic: Negative.   Neurological: Negative.   Hematological: Negative.    Psychiatric/Behavioral: Negative.       Today's Vitals   01/23/23 1541  BP: 110/80  Pulse: 85  Temp: 98.8 F (37.1 C)  TempSrc: Oral  Weight: 221 lb (100.2 kg)  Height: 6\' 2"  (1.88 m)  PainSc: 10-Worst pain ever   Body mass index is 28.37 kg/m.  Wt Readings from Last 3 Encounters:  01/23/23 221 lb (100.2 kg)  06/15/18 217 lb (98.4 kg)  07/07/17 222 lb (100.7 kg)    The ASCVD Risk score (Arnett DK, et al., 2019) failed to calculate for the following reasons:   Cannot find a previous HDL lab   Cannot find a previous total cholesterol lab ++ Objective:  Physical Exam Vitals reviewed.  Constitutional:      General: He is not in acute distress.    Appearance: Normal appearance. He is obese.  HENT:     Head: Normocephalic.  Cardiovascular:     Rate and Rhythm: Normal rate and regular rhythm.     Pulses: Normal pulses.     Heart sounds: Normal heart sounds. No murmur heard. Pulmonary:     Effort: Pulmonary effort is normal. No respiratory distress.     Breath sounds: Normal breath sounds. No wheezing.  Musculoskeletal:        General: Normal range of motion.  Skin:    General: Skin is warm and dry.     Capillary Refill: Capillary refill takes less than 2 seconds.  Neurological:     General: No focal deficit present.     Mental Status: He is alert and oriented to person, place, and time.  Psychiatric:        Mood and Affect: Mood normal.        Behavior: Behavior normal.        Thought Content: Thought content normal.        Judgment: Judgment normal.         Assessment And Plan:     1. Joint pain in both hands Comments: Will check autoimmune panel. Encouraged to limit intake of high gluten foods - Autoimmune Profile - ANA, IFA (with reflex) - C-reactive protein - CMP14+EGFR - CBC - VITAMIN D 25 Hydroxy (Vit-D Deficiency, Fractures) - DG Hand 2 View Right; Future - DG Hand 2 View Left; Future - TSH  2. Herpes zoster vaccination declined Declines  shingrix, educated on disease process and is aware if he changes his mind to notify office  3. Encounter for screening colonoscopy - Ambulatory referral to Gastroenterology  4. Family history of colon cancer - Ambulatory referral to Gastroenterology  5. Cataract of right eye, unspecified cataract type Comments: Reported by patient. continue f/u with opthalmology    Return in about 3 months (around 04/25/2023) for physical when able.   Patient was given opportunity to  ask questions. Patient verbalized understanding of the plan and was able to repeat key elements of the plan. All questions were answered to their satisfaction.  Darryl Felts, FNP   I, Darryl Felts, FNP, have reviewed all documentation for this visit. The documentation on 01/23/23 for the exam, diagnosis, procedures, and orders are all accurate and complete.   IF YOU HAVE BEEN REFERRED TO A SPECIALIST, IT MAY TAKE 1-2 WEEKS TO SCHEDULE/PROCESS THE REFERRAL. IF YOU HAVE NOT HEARD FROM US/SPECIALIST IN TWO WEEKS, PLEASE GIVE [place] A CALL AT [phone].   THE PATIENT IS ENCOURAGED TO PRACTICE SOCIAL DISTANCING DUE TO THE COVID-19 PANDEMIC.

## 2023-01-27 LAB — CMP14+EGFR
ALT: 34 IU/L (ref 0–44)
AST: 20 IU/L (ref 0–40)
Albumin/Globulin Ratio: 1.5 (ref 1.2–2.2)
Albumin: 4.5 g/dL (ref 3.8–4.9)
Alkaline Phosphatase: 97 IU/L (ref 44–121)
BUN/Creatinine Ratio: 15 (ref 9–20)
BUN: 14 mg/dL (ref 6–24)
Bilirubin Total: 0.6 mg/dL (ref 0.0–1.2)
CO2: 21 mmol/L (ref 20–29)
Calcium: 9.5 mg/dL (ref 8.7–10.2)
Chloride: 103 mmol/L (ref 96–106)
Creatinine, Ser: 0.92 mg/dL (ref 0.76–1.27)
Globulin, Total: 3 g/dL (ref 1.5–4.5)
Glucose: 98 mg/dL (ref 70–99)
Potassium: 4 mmol/L (ref 3.5–5.2)
Sodium: 141 mmol/L (ref 134–144)
Total Protein: 7.5 g/dL (ref 6.0–8.5)
eGFR: 96 mL/min/{1.73_m2} (ref >59)

## 2023-01-27 LAB — CBC
Hematocrit: 45.4 % (ref 37.5–51.0)
Hemoglobin: 15.3 g/dL (ref 13.0–17.7)
MCH: 30.9 pg (ref 26.6–33.0)
MCHC: 33.7 g/dL (ref 31.5–35.7)
MCV: 92 fL (ref 79–97)
Platelets: 260 10*3/uL (ref 150–450)
RBC: 4.95 x10E6/uL (ref 4.14–5.80)
RDW: 13 % (ref 11.6–15.4)
WBC: 7.2 10*3/uL (ref 3.4–10.8)

## 2023-01-27 LAB — TSH: TSH: 1.14 u[IU]/mL (ref 0.450–4.500)

## 2023-01-27 LAB — AUTOIMMUNE PROFILE
Anti Nuclear Antibody (ANA): NEGATIVE
Complement C3, Serum: 139 mg/dL (ref 82–167)
dsDNA Ab: <1 IU/mL (ref 0–9)

## 2023-01-27 LAB — ANTINUCLEAR ANTIBODIES, IFA: ANA Titer 1: NEGATIVE

## 2023-01-27 LAB — C-REACTIVE PROTEIN: CRP: 1 mg/L (ref 0–10)

## 2023-01-27 LAB — VITAMIN D 25 HYDROXY (VIT D DEFICIENCY, FRACTURES): Vit D, 25-Hydroxy: 33.3 ng/mL (ref 30.0–100.0)

## 2023-03-06 ENCOUNTER — Other Ambulatory Visit: Payer: Self-pay

## 2023-03-06 ENCOUNTER — Emergency Department (HOSPITAL_COMMUNITY)
Admission: EM | Admit: 2023-03-06 | Discharge: 2023-03-06 | Disposition: A | Discharge status: Home / Self Care | Payer: BC Managed Care – PPO | Attending: Emergency Medicine | Admitting: Emergency Medicine

## 2023-03-06 DIAGNOSIS — M5459 Other low back pain: Secondary | ICD-10-CM | POA: Diagnosis not present

## 2023-03-06 DIAGNOSIS — M545 Low back pain, unspecified: Secondary | ICD-10-CM | POA: Diagnosis not present

## 2023-03-06 MED ORDER — PREDNISONE 50 MG PO TABS: ORAL_TABLET | ORAL | 0 refills | Status: DC

## 2023-03-06 NOTE — ED Provider Notes (Signed)
[place] EMERGENCY DEPARTMENT AT [facility] Provider Note   CSN: [id number] Arrival date & time: 03/06/23  1208     History  Chief Complaint  Patient presents with   Back Pain    Darryl Hull is a 57 y.o. male.  Pt complains of pain in his low back.  Patient reports he was working today and had increased pain.  Patient reports that he has had similar in the past.  Patient states that he is given injections of steroids.  His last injection was 3 months ago.  He thinks maybe the medication has worn off.  Patient has a prescription for gabapentin tramadol and baclofen.  He states he is not currently taking these medications.  The history is provided by the patient. No language interpreter was used.  Back Pain Location:  Lumbar spine Quality:  Aching Radiates to:  Does not radiate Pain severity:  Moderate Pain is:  Same all the time Timing:  Constant Progression:  Worsening Chronicity:  New Relieved by:  Nothing Worsened by:  Nothing Ineffective treatments:  None tried Associated symptoms: no numbness        Home Medications Prior to Admission medications   Medication Sig Start Date End Date Taking? Authorizing Provider  predniSONE (DELTASONE) 50 MG tablet One tablet a day for 5 days 03/06/23  Yes Elson Areas, PA-C  baclofen (LIORESAL) 10 MG tablet Take 1 tablet (10 mg total) by mouth 3 (three) times daily as needed for muscle spasms. 04/11/22   Kathryne Hitch, MD  bismuth-metronidazole-tetracycline [facility]) [phone] MG capsule Take 3 capsules by mouth 4 (four) times daily -  before meals and at bedtime. 05/09/17   Meryl Dare, MD  diclofenac (VOLTAREN) 75 MG EC tablet Take 1 tablet (75 mg total) by mouth 2 (two) times daily between meals as needed. 07/17/22   Kathryne Hitch, MD  erythromycin ophthalmic ointment Place a 1/2 inch ribbon of ointment into the lower eyelid four times a day for 5 days 12/26/20   Virgina Norfolk, DO  gabapentin  (NEURONTIN) 100 MG capsule Take 1 capsule (100 mg total) by mouth 3 (three) times daily as needed. 05/09/22   Kathryne Hitch, MD  HYDROcodone-acetaminophen (NORCO/VICODIN) 5-325 MG tablet Take 1 tablet by mouth every 6 (six) hours as needed for moderate pain. 12/06/21   Kathryne Hitch, MD  ibuprofen (ADVIL) 800 MG tablet Take 1 tablet (800 mg total) by mouth every 8 (eight) hours as needed. 01/24/22   Kathryne Hitch, MD  lidocaine (LIDODERM) 5 % Place 1 patch onto the skin daily. Remove & Discard patch within 12 hours or as directed by MD 05/07/21   Loeffler, Finis Bud, PA-C  promethazine (PHENERGAN) 25 MG tablet Take 1 tablet (25 mg total) by mouth every 6 (six) hours as needed for nausea or vomiting. 02/09/17   Bethann Berkshire, MD  ranitidine (ZANTAC) 150 MG tablet Take 1 tablet (150 mg total) by mouth 2 (two) times daily. 02/17/17   Esterwood, Amy S, PA-C  traMADol (ULTRAM) 50 MG tablet Take 1-2 tablets (50-100 mg total) by mouth every 12 (twelve) hours as needed. 07/17/22   Kathryne Hitch, MD      Allergies    Patient has no known allergies.    Review of Systems   Review of Systems  Musculoskeletal:  Positive for back pain.  Neurological:  Negative for numbness.  All other systems reviewed and are negative.   Physical Exam Updated Vital Signs  BP (!) 155/101 (BP Location: Right Arm)   Pulse 69   Temp 98.1 F (36.7 C) (Oral)   Resp 17   SpO2 100%  Physical Exam Vitals and nursing note reviewed.  Constitutional:      Appearance: He is well-developed.  HENT:     Head: Normocephalic.  Cardiovascular:     Rate and Rhythm: Normal rate.  Pulmonary:     Effort: Pulmonary effort is normal.  Abdominal:     General: There is no distension.  Musculoskeletal:        General: Normal range of motion.     Cervical back: Normal range of motion.     Comments: Tender left lower lumbar spine, full range of motion neurovascular neurosensory are intact  Skin:     General: Skin is warm.  Neurological:     General: No focal deficit present.     Mental Status: He is alert and oriented to person, place, and time.     ED Results / Procedures / Treatments   Labs (all labs ordered are listed, but only abnormal results are displayed) Labs Reviewed - No data to display  EKG None  Radiology No results found.  Procedures Procedures    Medications Ordered in ED Medications - No data to display  ED Course/ Medical Decision Making/ A&P                             Medical Decision Making Patient complains of low back pain patient reports he has a history of injuring his back on the job.  Risk Prescription drug management. Risk Details: Patient is having exacerbation of chronic low back pain.  I advised the patient that he can take his medications that have been prescribed.  I will treat him with 5 days of prednisone to see if this helps his exacerbation of his symptoms.  Patient is advised to call his orthopedist to be seen for recheck.           Final Clinical Impression(s) / ED Diagnoses Final diagnoses:  Acute low back pain without sciatica, unspecified back pain laterality    Rx / DC Orders ED Discharge Orders          Ordered    predniSONE (DELTASONE) 50 MG tablet        03/06/23 1414           An After Visit Summary was printed and given to the patient.    Elson Areas, PA-C 03/06/23 1512    Elayne Snare K, DO 03/06/23 1549

## 2023-03-06 NOTE — ED Triage Notes (Signed)
Patient reports lumbar spine injury one year ago, for which he was receiving injections for pain with relief. Recently having increased pain and was prescribed baclofen, tramadol, gabapentin but states he is not taking those because the doctor told him to take only for a short period of time, though multiple pills of each are left in the bottle. Taking ibuprofen without relief.

## 2023-03-07 ENCOUNTER — Ambulatory Visit
Admission: RE | Admit: 2023-03-07 | Discharge: 2023-03-07 | Disposition: A | Discharge status: Home / Self Care | Payer: BC Managed Care – PPO | Source: Ambulatory Visit | Attending: Nurse Practitioner | Admitting: Nurse Practitioner

## 2023-03-07 DIAGNOSIS — M25541 Pain in joints of right hand: Secondary | ICD-10-CM

## 2023-03-07 DIAGNOSIS — M25542 Pain in joints of left hand: Secondary | ICD-10-CM | POA: Diagnosis not present

## 2023-03-07 DIAGNOSIS — M7989 Other specified soft tissue disorders: Secondary | ICD-10-CM | POA: Diagnosis not present

## 2023-03-07 DIAGNOSIS — M151 Heberden's nodes (with arthropathy): Secondary | ICD-10-CM | POA: Diagnosis not present

## 2023-03-10 ENCOUNTER — Encounter: Payer: Self-pay | Admitting: Gastroenterology

## 2023-04-01 ENCOUNTER — Ambulatory Visit: Payer: BC Managed Care – PPO | Admitting: Orthopaedic Surgery

## 2023-04-22 ENCOUNTER — Encounter: Payer: BC Managed Care – PPO | Admitting: Gastroenterology

## 2023-05-20 NOTE — Progress Notes (Unsigned)
Madelaine Bhat, CMA,acting as a Neurosurgeon for Arnette Felts, FNP.,have documented all relevant documentation on the behalf of Arnette Felts, FNP,as directed by  Arnette Felts, FNP while in the presence of Arnette Felts, FNP.  Subjective:   Patient ID: Darryl Hull , male    DOB: 08-Sep-1966 , 57 y.o.   MRN: [id number]  No chief complaint on file.   HPI  Patient presents today for HM, Patient reports compliance with medications. Patient denies any Chest pain, SOB, and headaches. Patient has no concerns today.     No past medical history on file.   Family History  Problem Relation Age of Onset  . Colon cancer Father 46     Current Outpatient Medications:  .  baclofen (LIORESAL) 10 MG tablet, Take 1 tablet (10 mg total) by mouth 3 (three) times daily as needed for muscle spasms., Disp: 60 each, Rfl: 1 .  bismuth-metronidazole-tetracycline (PYLERA) 140-125-125 MG capsule, Take 3 capsules by mouth 4 (four) times daily -  before meals and at bedtime., Disp: 168 capsule, Rfl: 0 .  diclofenac (VOLTAREN) 75 MG EC tablet, Take 1 tablet (75 mg total) by mouth 2 (two) times daily between meals as needed., Disp: 60 tablet, Rfl: 2 .  erythromycin ophthalmic ointment, Place a 1/2 inch ribbon of ointment into the lower eyelid four times a day for 5 days, Disp: 3.5 g, Rfl: 0 .  gabapentin (NEURONTIN) 100 MG capsule, Take 1 capsule (100 mg total) by mouth 3 (three) times daily as needed., Disp: 60 capsule, Rfl: 1 .  HYDROcodone-acetaminophen (NORCO/VICODIN) 5-325 MG tablet, Take 1 tablet by mouth every 6 (six) hours as needed for moderate pain., Disp: 30 tablet, Rfl: 0 .  ibuprofen (ADVIL) 800 MG tablet, Take 1 tablet (800 mg total) by mouth every 8 (eight) hours as needed., Disp: 60 tablet, Rfl: 3 .  lidocaine (LIDODERM) 5 %, Place 1 patch onto the skin daily. Remove & Discard patch within 12 hours or as directed by MD, Disp: 10 patch, Rfl: 0 .  predniSONE (DELTASONE) 50 MG tablet, One tablet a day for 5  days, Disp: 5 tablet, Rfl: 0 .  promethazine (PHENERGAN) 25 MG tablet, Take 1 tablet (25 mg total) by mouth every 6 (six) hours as needed for nausea or vomiting., Disp: 15 tablet, Rfl: 0 .  ranitidine (ZANTAC) 150 MG tablet, Take 1 tablet (150 mg total) by mouth 2 (two) times daily., Disp: 60 tablet, Rfl: 2 .  traMADol (ULTRAM) 50 MG tablet, Take 1-2 tablets (50-100 mg total) by mouth every 12 (twelve) hours as needed., Disp: 40 tablet, Rfl: 0   No Known Allergies   Men's preventive visit. Patient Health Questionnaire (PHQ-2) is  Flowsheet Row Office Visit from 01/23/2023 in [facility]  PHQ-2 Total Score 0     . Patient is on a *** diet. Marital status: Married. Relevant history for alcohol use is:  Social History   Substance and Sexual Activity  Alcohol Use Yes  . Alcohol/week: 5.0 standard drinks of alcohol  . Types: 5 Cans of beer per week   Comment: occasionally   . Relevant history for tobacco use is:  Social History   Tobacco Use  Smoking Status Never  Smokeless Tobacco Never  .   Review of Systems  Constitutional: Negative.   HENT: Negative.    Eyes: Negative.   Respiratory: Negative.    Cardiovascular: Negative.   Gastrointestinal: Negative.   Endocrine: Negative.   Genitourinary: Negative.  Musculoskeletal: Negative.   Skin: Negative.   Allergic/Immunologic: Negative.   Hematological: Negative.   Psychiatric/Behavioral: Negative.      There were no vitals filed for this visit. There is no height or weight on file to calculate BMI.  Wt Readings from Last 3 Encounters:  01/23/23 221 lb (100.2 kg)  06/15/18 217 lb (98.4 kg)  07/07/17 222 lb (100.7 kg)    Objective:  Physical Exam      Assessment And Plan:    Encounter for annual health examination  Family history of colon cancer  Cataract of right eye, unspecified cataract type     No follow-ups on file. Patient was given opportunity to ask questions. Patient  verbalized understanding of the plan and was able to repeat key elements of the plan. All questions were answered to their satisfaction.   Arnette Felts, FNP  I, Arnette Felts, FNP, have reviewed all documentation for this visit. The documentation on 05/20/23 for the exam, diagnosis, procedures, and orders are all accurate and complete.

## 2023-05-21 ENCOUNTER — Ambulatory Visit (INDEPENDENT_AMBULATORY_CARE_PROVIDER_SITE_OTHER): Payer: BC Managed Care – PPO | Admitting: Nurse Practitioner

## 2023-05-21 ENCOUNTER — Encounter: Payer: Self-pay | Admitting: Nurse Practitioner

## 2023-05-21 VITALS — BP 120/80 | HR 98 | Temp 98.0°F | Ht 74.0 in | Wt 220.4 lb

## 2023-05-21 DIAGNOSIS — Z13228 Encounter for screening for other metabolic disorders: Secondary | ICD-10-CM | POA: Diagnosis not present

## 2023-05-21 DIAGNOSIS — Z0000 Encounter for general adult medical examination without abnormal findings: Secondary | ICD-10-CM | POA: Diagnosis not present

## 2023-05-21 DIAGNOSIS — M25542 Pain in joints of left hand: Secondary | ICD-10-CM

## 2023-05-21 DIAGNOSIS — M79672 Pain in left foot: Secondary | ICD-10-CM | POA: Diagnosis not present

## 2023-05-21 DIAGNOSIS — M19042 Primary osteoarthritis, left hand: Secondary | ICD-10-CM

## 2023-05-21 DIAGNOSIS — Z13220 Encounter for screening for lipoid disorders: Secondary | ICD-10-CM

## 2023-05-21 DIAGNOSIS — H269 Unspecified cataract: Secondary | ICD-10-CM

## 2023-05-21 DIAGNOSIS — Z800 Family history of malignant neoplasm of digestive organs: Secondary | ICD-10-CM

## 2023-05-21 DIAGNOSIS — Z125 Encounter for screening for malignant neoplasm of prostate: Secondary | ICD-10-CM | POA: Diagnosis not present

## 2023-05-21 DIAGNOSIS — M25541 Pain in joints of right hand: Secondary | ICD-10-CM

## 2023-05-21 DIAGNOSIS — Z2821 Immunization not carried out because of patient refusal: Secondary | ICD-10-CM

## 2023-05-21 DIAGNOSIS — M79671 Pain in right foot: Secondary | ICD-10-CM | POA: Diagnosis not present

## 2023-05-21 DIAGNOSIS — M19041 Primary osteoarthritis, right hand: Secondary | ICD-10-CM

## 2023-05-21 MED ORDER — MELOXICAM 15 MG PO TABS: 15.0000 mg | ORAL_TABLET | Freq: Every day | ORAL | 1 refills | Status: AC

## 2023-05-21 MED ORDER — ACETAMINOPHEN 325 MG PO TABS: 650.0000 mg | ORAL_TABLET | ORAL | Status: AC | PRN

## 2023-05-21 NOTE — Assessment & Plan Note (Signed)
Behavior modifications discussed and diet history reviewed.   Pt will continue to exercise regularly and modify diet with low GI, plant based foods and decrease intake of processed foods.  Recommend intake of daily multivitamin, Vitamin D, and calcium.  Recommend colonoscopy (done at [facility], will get records) for preventive screenings, as well as recommend immunizations that include influenza, TDAP, and Shingles (declined)

## 2023-05-21 NOTE — Assessment & Plan Note (Signed)
Continues to have pain to his hands, he was diagnosed with osteoarthritis

## 2023-05-21 NOTE — Assessment & Plan Note (Signed)
Will refer to orthopedics may be related to arthritis and wearing steel toe shoes

## 2023-05-21 NOTE — Patient Instructions (Addendum)
I have placed an order for you to be seen by an orthopedic for your hand and feet pain. Please let me know you do not hear from them by Monday.   He hecho una orden para que usted sea atendido por un ortopdico por su dolor en manos y pies. Por favor, avseme que no tendr noticias suyas hasta el lunes.  I have sent you a prescription for meloxicam to take daily to see if this helps with your hand and foot pain.   Le he enviado una receta de meloxicam para que tome diariamente para ver si le ayuda con el dolor de manos y pies.

## 2023-05-21 NOTE — Assessment & Plan Note (Signed)
Declines shingrix, educated on disease process and is aware if he changes his mind to notify office  

## 2023-05-21 NOTE — Assessment & Plan Note (Signed)

## 2023-05-22 LAB — LIPID PANEL
Chol/HDL Ratio: 4 ratio (ref 0.0–5.0)
Cholesterol, Total: 215 mg/dL — ABNORMAL HIGH (ref 100–199)
HDL: 54 mg/dL (ref >39)
LDL Chol Calc (NIH): 141 mg/dL — ABNORMAL HIGH (ref 0–99)
Triglycerides: 111 mg/dL (ref 0–149)
VLDL Cholesterol Cal: 20 mg/dL (ref 5–40)

## 2023-05-22 LAB — CMP14+EGFR
ALT: 35 IU/L (ref 0–44)
AST: 24 IU/L (ref 0–40)
Albumin: 4.5 g/dL (ref 3.8–4.9)
Alkaline Phosphatase: 86 IU/L (ref 44–121)
BUN/Creatinine Ratio: 16 (ref 9–20)
BUN: 14 mg/dL (ref 6–24)
Bilirubin Total: 1 mg/dL (ref 0.0–1.2)
CO2: 25 mmol/L (ref 20–29)
Calcium: 9.7 mg/dL (ref 8.7–10.2)
Chloride: 101 mmol/L (ref 96–106)
Creatinine, Ser: 0.88 mg/dL (ref 0.76–1.27)
Globulin, Total: 3 g/dL (ref 1.5–4.5)
Glucose: 93 mg/dL (ref 70–99)
Potassium: 4 mmol/L (ref 3.5–5.2)
Sodium: 140 mmol/L (ref 134–144)
Total Protein: 7.5 g/dL (ref 6.0–8.5)
eGFR: 99 mL/min/{1.73_m2} (ref >59)

## 2023-05-22 LAB — CBC WITH DIFFERENTIAL/PLATELET
Basophils Absolute: 0.1 10*3/uL (ref 0.0–0.2)
Basos: 1 %
EOS (ABSOLUTE): 0.2 10*3/uL (ref 0.0–0.4)
Eos: 2 %
Hematocrit: 47.1 % (ref 37.5–51.0)
Hemoglobin: 15.1 g/dL (ref 13.0–17.7)
Immature Grans (Abs): 0 10*3/uL (ref 0.0–0.1)
Immature Granulocytes: 0 %
Lymphocytes Absolute: 2.8 10*3/uL (ref 0.7–3.1)
Lymphs: 36 %
MCH: 30.7 pg (ref 26.6–33.0)
MCHC: 32.1 g/dL (ref 31.5–35.7)
MCV: 96 fL (ref 79–97)
Monocytes Absolute: 0.7 10*3/uL (ref 0.1–0.9)
Monocytes: 9 %
Neutrophils Absolute: 4 10*3/uL (ref 1.4–7.0)
Neutrophils: 52 %
Platelets: 281 10*3/uL (ref 150–450)
RBC: 4.92 x10E6/uL (ref 4.14–5.80)
RDW: 12.8 % (ref 11.6–15.4)
WBC: 7.8 10*3/uL (ref 3.4–10.8)

## 2023-05-22 LAB — PSA: Prostate Specific Ag, Serum: 1 ng/mL (ref 0.0–4.0)

## 2023-05-22 LAB — HEMOGLOBIN A1C
Est. average glucose Bld gHb Est-mCnc: 105 mg/dL
Hgb A1c MFr Bld: 5.3 % (ref 4.8–5.6)

## 2023-06-16 DIAGNOSIS — M79642 Pain in left hand: Secondary | ICD-10-CM | POA: Diagnosis not present

## 2023-06-16 DIAGNOSIS — M79641 Pain in right hand: Secondary | ICD-10-CM | POA: Diagnosis not present

## 2023-08-04 ENCOUNTER — Encounter: Payer: Self-pay | Admitting: Internal Medicine

## 2023-08-04 ENCOUNTER — Telehealth: Payer: Self-pay | Admitting: Internal Medicine

## 2023-08-12 DIAGNOSIS — M549 Dorsalgia, unspecified: Secondary | ICD-10-CM | POA: Diagnosis not present

## 2023-08-12 DIAGNOSIS — M25561 Pain in right knee: Secondary | ICD-10-CM | POA: Diagnosis not present

## 2023-08-12 DIAGNOSIS — M25562 Pain in left knee: Secondary | ICD-10-CM | POA: Diagnosis not present

## 2023-08-12 DIAGNOSIS — M79671 Pain in right foot: Secondary | ICD-10-CM | POA: Diagnosis not present

## 2023-08-12 DIAGNOSIS — M1990 Unspecified osteoarthritis, unspecified site: Secondary | ICD-10-CM | POA: Diagnosis not present

## 2023-08-12 DIAGNOSIS — M2550 Pain in unspecified joint: Secondary | ICD-10-CM | POA: Diagnosis not present

## 2023-08-12 DIAGNOSIS — M79643 Pain in unspecified hand: Secondary | ICD-10-CM | POA: Diagnosis not present

## 2023-08-12 DIAGNOSIS — M79672 Pain in left foot: Secondary | ICD-10-CM | POA: Diagnosis not present

## 2023-09-05 ENCOUNTER — Ambulatory Visit: Payer: BC Managed Care – PPO

## 2023-09-19 ENCOUNTER — Encounter: Payer: BC Managed Care – PPO | Admitting: Internal Medicine

## 2023-10-01 ENCOUNTER — Ambulatory Visit (AMBULATORY_SURGERY_CENTER): Payer: BC Managed Care – PPO

## 2023-10-01 VITALS — Ht 74.0 in | Wt 217.6 lb

## 2023-10-01 DIAGNOSIS — Z800 Family history of malignant neoplasm of digestive organs: Secondary | ICD-10-CM

## 2023-10-01 MED ORDER — NA SULFATE-K SULFATE-MG SULF 17.5-3.13-1.6 GM/177ML PO SOLN: 1.0000 | Freq: Once | ORAL | 0 refills | Status: AC

## 2023-10-01 NOTE — Progress Notes (Signed)
Previsit completed using [facility] interpreter, [place].

## 2023-10-01 NOTE — Progress Notes (Signed)

## 2023-10-21 ENCOUNTER — Encounter: Payer: Self-pay | Admitting: Internal Medicine

## 2023-10-28 ENCOUNTER — Ambulatory Visit: Payer: BC Managed Care – PPO | Admitting: Internal Medicine

## 2023-10-28 ENCOUNTER — Encounter: Payer: Self-pay | Admitting: Internal Medicine

## 2023-10-28 VITALS — BP 142/99 | HR 79 | Temp 97.8°F | Resp 11 | Ht 74.0 in | Wt 217.0 lb

## 2023-10-28 DIAGNOSIS — Z800 Family history of malignant neoplasm of digestive organs: Secondary | ICD-10-CM

## 2023-10-28 DIAGNOSIS — D123 Benign neoplasm of transverse colon: Secondary | ICD-10-CM

## 2023-10-28 DIAGNOSIS — Z1211 Encounter for screening for malignant neoplasm of colon: Secondary | ICD-10-CM

## 2023-10-28 MED ORDER — SODIUM CHLORIDE 0.9 % IV SOLN: 500.0000 mL | Freq: Once | INTRAVENOUS | Status: DC

## 2023-10-28 NOTE — Patient Instructions (Signed)
 USTED TUVO UN PROCEDIMIENTO ENDOSCPICO HOY EN EL [facility]:   Lea el informe del procedimiento que se le entreg para cualquier pregunta especfica sobre lo que se Dentist.  Si el informe del examen no responde a sus preguntas, por favor llame a su gastroenterlogo para aclararlo.  Si usted solicit que no se le den [employer] de lo que se Clinical cytogeneticist en su procedimiento al [employer] va a cuidar, entonces el informe del procedimiento se ha incluido en un sobre sellado para que usted lo revise despus cuando le sea ms conveniente.   LO QUE PUEDE ESPERAR: Algunas sensaciones de hinchazn en el abdomen.  Puede tener ms gases de lo normal.  El caminar puede ayudarle a eliminar el aire que se le puso en el tracto gastrointestinal durante el procedimiento y reducir la hinchazn.  Si le hicieron una endoscopia inferior (como una colonoscopia o una sigmoidoscopia flexible), podra notar manchas de sangre en las heces fecales o en el papel higinico.  Si se someti a una preparacin intestinal para su procedimiento, es posible que no tenga una evacuacin intestinal normal durante [employer].   Tenga en cuenta:  Es posible que note un poco de irritacin y congestin en la nariz o algn drenaje.  Esto es debido al oxgeno [employer] durante su procedimiento.  No hay que preocuparse y esto debe desaparecer ms o Regulatory affairs officer.   SNTOMAS PARA REPORTAR INMEDIATAMENTE:  Despus de una endoscopia inferior (colonoscopia o sigmoidoscopia flexible):  Cantidades excesivas de sangre en las heces fecales  Sensibilidad significativa o empeoramiento de los dolores abdominales   Hinchazn aguda del abdomen que antes no tena   Fiebre de 100F o ms   Para asuntos urgentes o de Associate Professor, puede comunicarse con un gastroenterlogo a cualquier hora llamando al [phone].  DIETA:  Recomendamos una comida pequea al principio, pero luego puede continuar con su dieta normal.  Tome  muchos lquidos, Tax adviser las bebidas alcohlicas durante 24 horas.    ACTIVIDAD:  Debe planear tomarse las cosas con calma por el resto del da y no debe CONDUCIR ni usar maquinaria pesada Patent examiner (debido a los medicamentos de sedacin utilizados durante el examen).     SEGUIMIENTO: Nuestro personal llamar al nmero que aparece en su historial al siguiente da hbil de su procedimiento para ver cmo se siente y para responder cualquier pregunta o inquietud que pueda tener con respecto a la informacin que se le dio despus del procedimiento. Si no podemos contactarle, le dejaremos un mensaje.  Sin embargo, si se siente bien y no tiene English as a second language teacher, no es necesario que nos devuelva la llamada.  Asumiremos que ha regresado a sus actividades diarias normales sin incidentes. Si se le tomaron algunas biopsias, le contactaremos por telfono o por carta en las prximas 3 semanas.  Si no ha sabido [employer] biopsias en el transcurso de 3 semanas, por favor llmenos al [phone].   FIRMAS/CONFIDENCIALIDAD: Usted y/o el acompaante que le cuide han firmado documentos que se ingresarn en su historial mdico electrnico.  Estas firmas atestiguan el hecho de que la informacin anterior

## 2023-10-28 NOTE — Progress Notes (Signed)
 Pt's states no medical or surgical changes since previsit or office visit.

## 2023-10-28 NOTE — Progress Notes (Unsigned)
  Gastroenterology History and Physical   Primary Care Physician:  Arnette Felts, FNP   Reason for Procedure:   FHx CRCA - father age 60  Plan:    Colonoscopy      HPI: Darryl Hull is a 58 y.o. male here for screening colonoscopy. 2018 exam w/ hemorrhoids, no polyps   Past Medical History:  Diagnosis Date   Sleep apnea    per patient    No past surgical history on file.  Prior to Admission medications   Medication Sig Start Date End Date Taking? Authorizing Provider  acetaminophen (TYLENOL) 325 MG tablet Take 2 tablets (650 mg total) by mouth every 4 (four) hours as needed. 05/21/23 05/20/24  Arnette Felts, FNP  meloxicam (MOBIC) 15 MG tablet Take 1 tablet (15 mg total) by mouth daily. 05/21/23   Arnette Felts, FNP    Current Outpatient Medications  Medication Sig Dispense Refill   acetaminophen (TYLENOL) 325 MG tablet Take 2 tablets (650 mg total) by mouth every 4 (four) hours as needed.     meloxicam (MOBIC) 15 MG tablet Take 1 tablet (15 mg total) by mouth daily. 90 tablet 1   Current Facility-Administered Medications  Medication Dose Route Frequency Provider Last Rate Last Admin   0.9 %  sodium chloride infusion  500 mL Intravenous Once Iva Boop, MD        Allergies as of 10/28/2023   (No Known Allergies)    Family History  Problem Relation Age of Onset   Colon cancer Father 38   Rectal cancer Neg Hx    Stomach cancer Neg Hx    Esophageal cancer Neg Hx     Social History   Socioeconomic History   Marital status: Married    Spouse name: Not on file   Number of children: Not on file   Years of education: Not on file   Highest education level: Not on file  Occupational History   Occupation: Junk yard  Tobacco Use   Smoking status: Never   Smokeless tobacco: Never  Substance and Sexual Activity   Alcohol use: Yes    Alcohol/week: 5.0 standard drinks of alcohol    Types: 5 Cans of beer per week    Comment: occasionally    Drug use: No    Sexual activity: Yes    Birth control/protection: None  Other Topics Concern   Not on file  Social History Narrative   ** Merged History Encounter **       Social Drivers of Corporate investment banker Strain: Not on file  Food Insecurity: Not on file  Transportation Needs: Not on file  Physical Activity: Not on file  Stress: Not on file  Social Connections: Not on file  Intimate Partner Violence: Not on file    Review of Systems:  All other review of systems negative except as mentioned in the HPI.  Physical Exam: Vital signs BP 122/87   Pulse (!) 107   Temp 97.8 F (36.6 C)   Ht 6\' 2"  (1.88 m)   Wt 217 lb (98.4 kg)   SpO2 100%   BMI 27.86 kg/m   General:   Alert,  Well-developed, well-nourished, pleasant and cooperative in NAD Lungs:  Clear throughout to auscultation.   Heart:  Regular rate and rhythm; no murmurs, clicks, rubs,  or gallops. Abdomen:  Soft, nontender and nondistended. Normal bowel sounds.   Neuro/Psych:  Alert and cooperative. Normal mood and affect. A and O x 3   @  Iva Boop, MD, [facility] [phone] (pager) 10/28/2023 4:19 PM@

## 2023-10-28 NOTE — Progress Notes (Unsigned)
 Called to room to assist during endoscopic procedure.  Patient ID and intended procedure confirmed with present staff. Received instructions for my participation in the procedure from the performing physician.

## 2023-10-28 NOTE — Op Note (Signed)
 [facility] Patient Name: Darryl Hull Procedure Date: 10/28/2023 4:11 PM MRN: [id number] Endoscopist: Iva Boop , MD, [id number] Age: 58 Referring MD:  Date of Birth: 1965/10/04 Gender: Male Account #: [id number] Procedure:                Colonoscopy Indications:              Screening in patient at increased risk: Colorectal                            cancer in father before age 83 Medicines:                Monitored Anesthesia Care Procedure:                Pre-Anesthesia Assessment:                           - Prior to the procedure, a History and Physical                            was performed, and patient medications and                            allergies were reviewed. The patient's tolerance of                            previous anesthesia was also reviewed. The risks                            and benefits of the procedure and the sedation                            options and risks were discussed with the patient.                            All questions were answered, and informed consent                            was obtained. Prior Anticoagulants: The patient has                            taken no anticoagulant or antiplatelet agents. ASA                            Grade Assessment: II - A patient with mild systemic                            disease. After reviewing the risks and benefits,                            the patient was deemed in satisfactory condition to                            undergo the procedure.  After obtaining informed consent, the colonoscope                            was passed under direct vision. Throughout the                            procedure, the patient's blood pressure, pulse, and                            oxygen saturations were monitored continuously. The                            Olympus CF-HQ190L ([id number]) Colonoscope was                            introduced through the anus and  advanced to the the                            cecum, identified by appendiceal orifice and                            ileocecal valve. The colonoscopy was performed                            without difficulty. The patient tolerated the                            procedure well. The quality of the bowel                            preparation was good. The ileocecal valve,                            appendiceal orifice, and rectum were photographed.                            The bowel preparation used was SUPREP via split                            dose instruction. Scope In: 4:49:24 PM Scope Out: 4:59:38 PM Scope Withdrawal Time: 0 hours 8 minutes 31 seconds  Total Procedure Duration: 0 hours 10 minutes 14 seconds  Findings:                 The perianal and digital rectal examinations were                            normal.                           A diminutive polyp was found in the transverse                            colon. The polyp was sessile. The polyp was removed  with a cold snare. Resection and retrieval were                            complete. Verification of patient identification                            for the specimen was done. Estimated blood loss was                            minimal.                           The exam was otherwise without abnormality on                            direct and retroflexion views. Complications:            No immediate complications. Estimated Blood Loss:     Estimated blood loss was minimal. Impression:               - One diminutive polyp in the transverse colon,                            removed with a cold snare. Resected and retrieved.                           - The examination was otherwise normal on direct                            and retroflexion views. Recommendation:           - Patient has a contact number available for                            emergencies. The signs and symptoms of  potential                            delayed complications were discussed with the                            patient. Return to normal activities tomorrow.                            Written discharge instructions were provided to the                            patient.                           - Resume previous diet.                           - Continue present medications.                           - Repeat colonoscopy in 5 years. (FHx CRCA father <  60) Iva Boop, MD 10/28/2023 5:05:19 PM This report has been signed electronically.

## 2023-10-28 NOTE — Progress Notes (Unsigned)
 Report given to PACU, vss

## 2023-10-29 ENCOUNTER — Telehealth: Payer: Self-pay | Admitting: *Deleted

## 2023-10-29 NOTE — Telephone Encounter (Signed)
 No answer on  follow up call. Left message.

## 2023-10-31 ENCOUNTER — Encounter: Payer: Self-pay | Admitting: Internal Medicine

## 2023-10-31 LAB — SURGICAL PATHOLOGY

## 2023-12-19 DIAGNOSIS — H40053 Ocular hypertension, bilateral: Secondary | ICD-10-CM | POA: Diagnosis not present

## 2023-12-29 DIAGNOSIS — H40053 Ocular hypertension, bilateral: Secondary | ICD-10-CM | POA: Diagnosis not present

## 2024-01-12 ENCOUNTER — Telehealth: Payer: Self-pay

## 2024-01-12 NOTE — Telephone Encounter (Signed)
 Called patient to make him aware he will need a appointment for a referral to Rheumatologist

## 2024-01-18 ENCOUNTER — Encounter (HOSPITAL_COMMUNITY): Payer: Self-pay

## 2024-01-18 ENCOUNTER — Other Ambulatory Visit: Payer: Self-pay

## 2024-01-18 ENCOUNTER — Emergency Department (HOSPITAL_COMMUNITY)
Admission: EM | Admit: 2024-01-18 | Discharge: 2024-01-19 | Disposition: A | Discharge status: Home / Self Care | Payer: Worker's Compensation | Attending: Emergency Medicine | Admitting: Emergency Medicine

## 2024-01-18 DIAGNOSIS — Y990 Civilian activity done for income or pay: Secondary | ICD-10-CM | POA: Diagnosis not present

## 2024-01-18 DIAGNOSIS — H2000 Unspecified acute and subacute iridocyclitis: Secondary | ICD-10-CM | POA: Diagnosis not present

## 2024-01-18 DIAGNOSIS — H5711 Ocular pain, right eye: Secondary | ICD-10-CM

## 2024-01-18 DIAGNOSIS — H40051 Ocular hypertension, right eye: Secondary | ICD-10-CM | POA: Diagnosis not present

## 2024-01-18 DIAGNOSIS — S0591XA Unspecified injury of right eye and orbit, initial encounter: Secondary | ICD-10-CM

## 2024-01-18 DIAGNOSIS — W458XXA Other foreign body or object entering through skin, initial encounter: Secondary | ICD-10-CM | POA: Diagnosis not present

## 2024-01-18 DIAGNOSIS — S0011XA Contusion of right eyelid and periocular area, initial encounter: Secondary | ICD-10-CM | POA: Diagnosis not present

## 2024-01-18 DIAGNOSIS — H2521 Age-related cataract, morgagnian type, right eye: Secondary | ICD-10-CM | POA: Diagnosis not present

## 2024-01-18 MED ORDER — HYDROCODONE-ACETAMINOPHEN 5-325 MG PO TABS
1.0000 | ORAL_TABLET | Freq: Once | ORAL | Status: AC
  Administered 2024-01-18: 1 via ORAL
  Filled 2024-01-18: qty 1

## 2024-01-18 MED ORDER — BRINZOLAMIDE 1 % OP SUSP: 1.0000 [drp] | Freq: Three times a day (TID) | OPHTHALMIC | 0 refills | Status: AC

## 2024-01-18 MED ORDER — HYDROMORPHONE HCL 1 MG/ML IJ SOLN
1.0000 mg | Freq: Once | INTRAMUSCULAR | Status: AC
  Administered 2024-01-18: 1 mg via INTRAVENOUS
  Filled 2024-01-18: qty 1

## 2024-01-18 MED ORDER — ACETAZOLAMIDE 250 MG PO TABS
500.0000 mg | ORAL_TABLET | ORAL | Status: AC
Start: 2024-01-18 — End: 2024-01-18
  Administered 2024-01-18: 500 mg via ORAL
  Filled 2024-01-18: qty 2

## 2024-01-18 MED ORDER — BRIMONIDINE TARTRATE-TIMOLOL 0.2-0.5 % OP SOLN: 1.0000 [drp] | Freq: Two times a day (BID) | OPHTHALMIC | 0 refills | Status: AC

## 2024-01-18 MED ORDER — HYDROCODONE-ACETAMINOPHEN 5-325 MG PO TABS
1.0000 | ORAL_TABLET | Freq: Four times a day (QID) | ORAL | 0 refills | Status: AC | PRN
Start: 2024-01-18 — End: ?

## 2024-01-18 MED ORDER — TETRACAINE HCL 0.5 % OP SOLN
2.0000 [drp] | Freq: Once | OPHTHALMIC | Status: AC
  Administered 2024-01-18: 2 [drp] via OPHTHALMIC
  Filled 2024-01-18: qty 4

## 2024-01-18 MED ORDER — FLUORESCEIN SODIUM 1 MG OP STRP
1.0000 | ORAL_STRIP | Freq: Once | OPHTHALMIC | Status: AC
  Administered 2024-01-18: 1 via OPHTHALMIC
  Filled 2024-01-18: qty 1

## 2024-01-18 MED ORDER — MANNITOL 25 % IV SOLN
100.0000 g | Status: AC
  Administered 2024-01-18: 100 g via INTRAVENOUS
  Filled 2024-01-18: qty 400

## 2024-01-18 MED ORDER — ACETAZOLAMIDE ER 500 MG PO CP12: 500.0000 mg | ORAL_CAPSULE | Freq: Two times a day (BID) | ORAL | 0 refills | Status: AC

## 2024-01-18 MED ORDER — PREDNISOLON-GATIFLOX-BROMFENAC 1-0.5-0.075 % OP SOLN: 1.0000 [drp] | Freq: Four times a day (QID) | OPHTHALMIC | 0 refills | Status: AC

## 2024-01-18 MED ORDER — ONDANSETRON HCL 4 MG/2ML IJ SOLN
4.0000 mg | Freq: Once | INTRAMUSCULAR | Status: AC
  Administered 2024-01-18: 4 mg via INTRAVENOUS
  Filled 2024-01-18: qty 2

## 2024-01-18 NOTE — ED Provider Notes (Addendum)
 [place] EMERGENCY DEPARTMENT AT [facility] Provider Note   CSN: [id number] Arrival date & time: 01/18/24  1616     History  Chief Complaint  Patient presents with   Eye Pain    Darryl Hull is a 58 y.o. male.  Patient family member able to interpret without any difficulty.  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  Struck the right eye.  Patient has a history of decreased vision in the right eye since he was a kid when he got struck with a rock.  And has had a white haziness to that eye for long-term.  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  Patient's left eye without any difficulties.  No nausea no vomiting.  Patient does not wear contacts.       Home Medications Prior to Admission medications   Medication Sig Start Date End Date Taking? Authorizing Provider  acetaminophen  (TYLENOL ) 325 MG tablet Take 2 tablets (650 mg total) by mouth every 4 (four) hours as needed. 05/21/23 05/20/24  Susanna Epley, FNP  meloxicam  (MOBIC ) 15 MG tablet Take 1 tablet (15 mg total) by mouth daily. 05/21/23   Susanna Epley, FNP      Allergies    Patient has no known allergies.    Review of Systems   Review of Systems  Constitutional:  Negative for chills and fever.  HENT:  Negative for ear pain and sore throat.   Eyes:  Positive for pain. Negative for visual disturbance.  Respiratory:  Negative for cough and shortness of breath.   Cardiovascular:  Negative for chest pain and palpitations.  Gastrointestinal:  Negative for abdominal pain and vomiting.  Genitourinary:  Negative for dysuria and hematuria.  Musculoskeletal:  Negative for arthralgias and back pain.  Skin:  Negative for color change and rash.  Neurological:  Negative for seizures and syncope.  All other systems reviewed and are negative.   Physical Exam Updated Vital Signs BP (!) 156/93 (BP Location: Left Arm)   Pulse 67   Temp 97.8 F (36.6 C) (Oral)   Resp 14   Ht 1.88 m  (6\' 2" )   Wt 98.4 kg   SpO2 100%   BMI 27.85 kg/m  Physical Exam Vitals and nursing note reviewed.  Constitutional:      General: He is not in acute distress.    Appearance: He is well-developed.  HENT:     Head: Normocephalic and atraumatic.     Mouth/Throat:     Mouth: Mucous membranes are moist.  Eyes:     Extraocular Movements: Extraocular movements intact.     Conjunctiva/sclera: Conjunctivae normal.     Comments: Patient with marked haziness to the right eye.  Seem to be consistent with cataract.  There is erythema to the right upper lid with some bruising.  Extract muscles are intact.  Pupil on the right side is not visualized.  The whole anterior chamber is significantly cloudy.  Patient states he can perceive some light in that eye.  Pupil normal on the left side and tubular activity on the left side does not cause any discomfort on the right.  Corneal staining without any significant uptake no evidence of any abrasion or ulcer.  Ocular pressure seems to be giving an error messages sort of read 7 but I do not know if that is accurate.  Cardiovascular:     Rate and Rhythm: Normal rate and regular rhythm.  Heart sounds: No murmur heard. Pulmonary:     Effort: Pulmonary effort is normal. No respiratory distress.     Breath sounds: Normal breath sounds.  Abdominal:     Palpations: Abdomen is soft.     Tenderness: There is no abdominal tenderness.  Musculoskeletal:        General: No swelling.     Cervical back: Neck supple.  Skin:    General: Skin is warm and dry.     Capillary Refill: Capillary refill takes less than 2 seconds.  Neurological:     General: No focal deficit present.     Mental Status: He is alert and oriented to person, place, and time.  Psychiatric:        Mood and Affect: Mood normal.     ED Results / Procedures / Treatments   Labs (all labs ordered are listed, but only abnormal results are displayed) Labs Reviewed - No data to  display  EKG None  Radiology No results found.  Procedures Procedures    Medications Ordered in ED Medications  fluorescein  ophthalmic strip 1 strip (has no administration in time range)  tetracaine  (PONTOCAINE) 0.5 % ophthalmic solution 2 drop (has no administration in time range)    ED Course/ Medical Decision Making/ A&P                                 Medical Decision Making Risk Prescription drug management.  Will Discuss with ophthalmology.  Patient states that the eye was not all cloudy like that there was evidence of a traumatic cataract from an eye injury when he was a child.  But he said the whole thing was not white like you are seeing today.  Pupils not even visualized ocular pressures kind to get in 7 but also getting an error message so not sure if that is accurate.  Corneal staining without any significant findings.  Final Clinical Impression(s) / ED Diagnoses Final diagnoses:  Acute right eye pain  Right eye injury, initial encounter    Rx / DC Orders ED Discharge Orders     None         Angella Montas, MD 01/18/24 1759  Ophthalmology will be coming in to see patient.  Have gotten them to move slit-lamp into the room.  Both eyes seem to be bloodshot now they were not really that way before.  Patient also complaining of increase pain to the right eye.  Will give some hydrocodone .    Melodie Ashworth, MD 01/18/24 [phone]  Ophthalmology has seen patient.  He thinks that he has ruptured an old cataract.  Has not set up an instance of inflammatory response.  Also thinks that ocular pressures are very high.  He is provided a list of the medications he wants him to be discharged home with.  I will send those to the pharmacy.  Patient has the ability to go to the pharmacy tonight to get those.  It will include some hydrocodone  for pain control.  He will see him in the office tomorrow as well.  As of right now he is planning to go home come back and then  recheck ocular pressures.  He is ordered some Diamox for him here.  Okay to discharge patient home by Dr. Tyrone Gallop.  Patient did receive his IV mannitol here.  Will give patient some IV pain medicine and antinausea medicine.  Has prescriptions.  He will see  him in the office in the morning.    Abygale Karpf, MD 01/18/24 Lynnell Sato    Cortni Tays, MD 01/18/24 2259

## 2024-01-18 NOTE — Discharge Instructions (Addendum)
 Take the pain medication as needed and directed.  Take the prescriptions as directed.  Follow-up with Dr. Tyrone Gallop in the office tomorrow.  Prescriptions have been sent to the [facility] on [address].  Work note provided.

## 2024-01-18 NOTE — ED Triage Notes (Signed)
 Pt was hit in his right eye with a plastic piece while working Friday. Pt has a cataract to that right eye and has a small lac and bruising to that side.

## 2024-01-18 NOTE — Final Consult Note (Addendum)
 Reason for consult:  HPI: Darryl Hull is an 58 y.o. male we are asked to see for eye pain OD.  The patient was struck in the right upper eyelid by broken plastic particle while working 3 days ago.  He had a small eyelid lac.  His eye felt normal.  Over the last 12 hrs though he's had worsening eye pain OD.  Redness.     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  Notably he was injured about two years ago at work and sounds like he had a traumatic hyphema and elevated IOP OD managed by [employer].         Past Medical History:  Diagnosis Date   Sleep apnea    per patient   History reviewed. No pertinent surgical history. Family History  Problem Relation Age of Onset   Colon cancer Father 53   Rectal cancer Neg Hx    Stomach cancer Neg Hx    Esophageal cancer Neg Hx    Current Facility-Administered Medications  Medication Dose Route Frequency Provider Last Rate Last Admin   HYDROcodone -acetaminophen  (NORCO/VICODIN) 5-325 MG per tablet 1 tablet  1 tablet Oral Once Zackowski, Scott, MD       Current Outpatient Medications  Medication Sig Dispense Refill   acetaminophen  (TYLENOL ) 325 MG tablet Take 2 tablets (650 mg total) by mouth every 4 (four) hours as needed.     meloxicam  (MOBIC ) 15 MG tablet Take 1 tablet (15 mg total) by mouth daily. 90 tablet 1   No Known Allergies Social History   Socioeconomic History   Marital status: Single    Spouse name: Not on file   Number of children: Not on file   Years of education: Not on file   Highest education level: Not on file  Occupational History   Occupation: Junk yard  Tobacco Use   Smoking status: Never   Smokeless tobacco: Never  Substance and Sexual Activity   Alcohol use: Yes    Alcohol/week: 5.0 standard drinks of alcohol    Types: 5 Cans of beer per week    Comment: occasionally    Drug use: No   Sexual activity: Yes    Birth  control/protection: None  Other Topics Concern   Not on file  Social History Narrative   ** Merged History Encounter **       Social Drivers of Corporate investment banker Strain: Not on file  Food Insecurity: Not on file  Transportation Needs: Not on file  Physical Activity: Not on file  Stress: Not on file  Social Connections: Not on file  Intimate Partner Violence: Not on file    Review of systems: Per ED note.   Physical Exam:  Blood pressure (!) 156/93, pulse 67, temperature 97.8 F (36.6 C), temperature source Oral, resp. rate 14, height 6\' 2"  (1.88 m), weight 98.4 kg, SpO2 100%.   [facility] [place] OD: LP   Pupils:   OD round, reactive to light, no APD            OS round, reactive to light, no APD  IOP (T pen)  OD ERROR   the eye is firm.    Motility:  OD full ductions  OS full ductions     Slit lamp examination:  OD                                       External/adnexa: Normal                                      Lids/lashes:        upper lid echymosis                                     Conjunctiva        1-2 + hyperemia       Cornea:              hazy              AC:                     HAZY view but AC is formed; the angle does not appear closed; diffuse haze; no hypopyon, no hyphema                             Iris:                     The iris is visible, but details are not.         Lens:                  white cataract is visible.                                      OS                                       External/adnexa: Normal                                      Lids/lashes:        Normal                                      Conjunctiva        White, quiet        Cornea:              Clear                  AC:                     Deep, quiet                                Iris:                     Normal        Lens:  Clear        Labs/studies: No results found for this or any previous visit (from  the past 48 hours). No results found.                           Assessment and Plan: Darryl Hull is an 58 y.o. 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    In the ED I've administered Atropine, Brimonidine, Timolol, dorzolamide and Pred Acetate  Recommend:   -- Combigan BID OD -- Azopt or Trusopt TID OD -- Prednisilone acetate QID  -- Diamox ER 500mg  PO BID.  (Generic equivalents for all of above are fine).   -- Follow up IOP check and exam with me tomorrow at [facility]   All of the above information was relayed to the patient and/or patient family.   Follow up contact information was provided.  All questions were answered.   Darryl Hull 01/18/2024, 7:26 PM  [facility] [phone]    Addendum:  After multiple rounds of Pred, Aphagan, Timolo, Dorzolamide, his IOP remains Error on T pen.  I did obtain one reading of 32.  The eye is less inflamed, and his comfort is improved.  At this point, I recommend IV mannitol and have discussed dosing with Pharmacy here at [facility].  Plan will be IV infusion of Mannitol, with planned discharge with drops and meds as above.

## 2024-01-19 DIAGNOSIS — H40051 Ocular hypertension, right eye: Secondary | ICD-10-CM | POA: Diagnosis not present

## 2024-01-19 DIAGNOSIS — H2521 Age-related cataract, morgagnian type, right eye: Secondary | ICD-10-CM | POA: Diagnosis not present

## 2024-01-19 NOTE — ED Notes (Signed)
 ED Provider Dr. Kermit Ped at patient bedside.

## 2024-01-19 NOTE — ED Provider Notes (Signed)
 This patient was seen by Dr. Zackowski for right eye pain.  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  He was started on Diamox, Pred forte, Combigan, and Trusopt.  His pain in the ED was treated with Norco followed by Dilaudid.  He received Dilaudid shortly prior to discharge.  At time of discharge, he endorsed sensation of wooziness.  He attempted to leave the ED in a wheelchair but requested to come back due to his ongoing fatigue. Physical Exam  BP 128/80   Pulse (!) 51   Temp 98.5 F (36.9 C) (Oral)   Resp (!) 21   Ht 6\' 2"  (1.88 m)   Wt 98.4 kg   SpO2 97%   BMI 27.85 kg/m   Physical Exam Vitals and nursing note reviewed.  Constitutional:      General: He is not in acute distress.    Appearance: Normal appearance. He is well-developed. He is not ill-appearing, toxic-appearing or diaphoretic.  HENT:     Head: Normocephalic and atraumatic.     Right Ear: External ear normal.     Left Ear: External ear normal.     Nose: Nose normal.     Mouth/Throat:     Mouth: Mucous membranes are moist.  Eyes:     Extraocular Movements: Extraocular movements intact.     Conjunctiva/sclera: Conjunctivae normal.  Cardiovascular:     Rate and Rhythm: Normal rate and regular rhythm.  Pulmonary:     Effort: Pulmonary effort is normal. No respiratory distress.  Abdominal:     General: There is no distension.     Palpations: Abdomen is soft.     Tenderness: There is no abdominal tenderness.  Musculoskeletal:        General: No swelling. Normal range of motion.     Cervical back: Normal range of motion and neck supple.  Skin:    General: Skin is warm and dry.     Coloration: Skin is not jaundiced or pale.  Neurological:     General: No focal deficit present.     Mental Status: He is alert and oriented to person, place, and time.     Cranial Nerves: No cranial nerve deficit.     Sensory: No sensory deficit.     Motor: No weakness.     Coordination: Coordination  normal.  Psychiatric:        Mood and Affect: Mood normal.        Behavior: Behavior normal.     Procedures  Procedures  ED Course / MDM    Medical Decision Making Risk Prescription drug management.   On exam, patient is somnolent.  He has no focal neurologic deficits.  Vital signs notable for bradycardia.  He is normotensive.  He is able to stand under his own power.  He does not have orthostatic hypotension.  He endorses some mild dizziness with standing.  I suspect his symptoms are secondary to dose of Dilaudid.  Will give him time to metabolize.  On reassessment, patient sleeping.  He is easily awakened.  He no longer feels dizzy or woozy.  He was able to ambulate to the bathroom on his own power.  Patient was discharged in stable condition.       Iva Mariner, MD 01/19/24 [phone]

## 2024-01-19 NOTE — ED Notes (Signed)
 Patient states "he is feeling better" and that he was able to ambulate without assistance to the restroom.

## 2024-01-19 NOTE — ED Notes (Signed)
 Patient was nauseous and diaphoretic ~ 20 minutes after received zofran and dilaudi; denies CP or SHOB, Patient in agreement to be monitored before discharge. ED provider Dr. Kermit Ped notified.

## 2024-01-19 NOTE — ED Notes (Signed)
 Patient states he is no longer nauseous and diaphoretic; reports he still feels very sedated would like to "stay for 30 more mins" for observation.

## 2024-01-20 DIAGNOSIS — H4051X0 Glaucoma secondary to other eye disorders, right eye, stage unspecified: Secondary | ICD-10-CM | POA: Diagnosis not present

## 2024-01-20 DIAGNOSIS — H2520 Age-related cataract, morgagnian type, unspecified eye: Secondary | ICD-10-CM | POA: Diagnosis not present

## 2024-01-21 DIAGNOSIS — Z79899 Other long term (current) drug therapy: Secondary | ICD-10-CM | POA: Diagnosis not present

## 2024-01-21 DIAGNOSIS — H4051X0 Glaucoma secondary to other eye disorders, right eye, stage unspecified: Secondary | ICD-10-CM | POA: Diagnosis not present

## 2024-01-21 DIAGNOSIS — H2521 Age-related cataract, morgagnian type, right eye: Secondary | ICD-10-CM | POA: Diagnosis not present

## 2024-01-21 DIAGNOSIS — H4051X2 Glaucoma secondary to other eye disorders, right eye, moderate stage: Secondary | ICD-10-CM | POA: Diagnosis not present

## 2024-01-21 DIAGNOSIS — H25819 Combined forms of age-related cataract, unspecified eye: Secondary | ICD-10-CM | POA: Diagnosis not present

## 2024-01-21 DIAGNOSIS — H26101 Unspecified traumatic cataract, right eye: Secondary | ICD-10-CM | POA: Diagnosis not present

## 2024-01-21 DIAGNOSIS — Z791 Long term (current) use of non-steroidal anti-inflammatories (NSAID): Secondary | ICD-10-CM | POA: Diagnosis not present

## 2024-04-22 DIAGNOSIS — H4051X0 Glaucoma secondary to other eye disorders, right eye, stage unspecified: Secondary | ICD-10-CM | POA: Diagnosis not present

## 2024-05-24 ENCOUNTER — Ambulatory Visit (INDEPENDENT_AMBULATORY_CARE_PROVIDER_SITE_OTHER): Payer: BC Managed Care – PPO | Admitting: Nurse Practitioner

## 2024-05-24 ENCOUNTER — Encounter: Payer: Self-pay | Admitting: Nurse Practitioner

## 2024-05-24 VITALS — BP 130/80 | HR 85 | Temp 98.5°F | Ht 74.0 in | Wt 218.6 lb

## 2024-05-24 DIAGNOSIS — M19041 Primary osteoarthritis, right hand: Secondary | ICD-10-CM | POA: Diagnosis not present

## 2024-05-24 DIAGNOSIS — M79672 Pain in left foot: Secondary | ICD-10-CM | POA: Diagnosis not present

## 2024-05-24 DIAGNOSIS — Z79899 Other long term (current) drug therapy: Secondary | ICD-10-CM | POA: Diagnosis not present

## 2024-05-24 DIAGNOSIS — H269 Unspecified cataract: Secondary | ICD-10-CM | POA: Diagnosis not present

## 2024-05-24 DIAGNOSIS — E782 Mixed hyperlipidemia: Secondary | ICD-10-CM

## 2024-05-24 DIAGNOSIS — M19042 Primary osteoarthritis, left hand: Secondary | ICD-10-CM

## 2024-05-24 DIAGNOSIS — Z1159 Encounter for screening for other viral diseases: Secondary | ICD-10-CM | POA: Diagnosis not present

## 2024-05-24 DIAGNOSIS — Z2821 Immunization not carried out because of patient refusal: Secondary | ICD-10-CM

## 2024-05-24 DIAGNOSIS — Z0000 Encounter for general adult medical examination without abnormal findings: Secondary | ICD-10-CM | POA: Diagnosis not present

## 2024-05-24 DIAGNOSIS — M79671 Pain in right foot: Secondary | ICD-10-CM

## 2024-05-24 DIAGNOSIS — Z125 Encounter for screening for malignant neoplasm of prostate: Secondary | ICD-10-CM | POA: Diagnosis not present

## 2024-05-24 MED ORDER — PREDNISONE 5 MG (21) PO TBPK: ORAL_TABLET | ORAL | 0 refills | Status: DC

## 2024-05-24 NOTE — Progress Notes (Signed)
 LILLETTE Kristeen JINNY Gladis, CMA,acting as a Neurosurgeon for Gaines Ada, FNP.,have documented all relevant documentation on the behalf of Gaines Ada, FNP,as directed by  Gaines Ada, FNP while in the presence of Gaines Ada, FNP.  Subjective:   Patient ID: Darryl Hull , male    DOB: July 04, 1966 , 58 y.o.   MRN: [id number]  Chief Complaint  Patient presents with   Annual Exam    Patient presents today for HM, Patient reports compliance with medication. Patient denies any chest pain, SOB, or headaches.    Hand Pain    Patient would like to get a referral to a rheumatologist for his hand pain.    Joint Pain    Patient reports he has joint pain all over. He speaks mostly about bilateral hand and bilateral feet pain in his big toe joint.  He reports his feet feel like he could have gout pain. He reports he uses his hands and feet all day for work.     HPI  Discussed the use of AI scribe software for clinical note transcription with the patient, who gave verbal consent to proceed.  History of Present Illness Kashten Stan is a 58 year old male who presents for an annual physical exam and management of arthritis pain. He is accompanied by his daughter.  He experiences joint pain, particularly in his hands and feet, which he suspects may be related to gout. He previously consulted a rheumatologist but did not receive the results of his tests due to a disagreement over appointment fees. The pain is described as dull but can become severe, affecting his ability to work and perform daily activities.  He has a history of a lumbar spine injury from two years ago, which required him to stay home for three months. He received injections for pain relief twice, which provided temporary relief. He continues to experience significant lumbar pain, especially when lying down, and describes it as a 'bomb' when combined with his arthritis pain.  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 His feet feel like they are on fire after work.  He follows a regular diet but acknowledges difficulty maintaining a cholesterol-friendly diet. He occasionally consumes oatmeal and Cheerios to help manage his cholesterol levels.  No issues with urination, chest pain, shortness of breath, trouble swallowing, constipation, or diarrhea. He reports regular bowel movements and no swelling in his feet or ankles. He has a history of COVID-19 infection but has never received a flu or COVID vaccine, expressing a preference against vaccinations.   Past Medical History:  Diagnosis Date   Sleep apnea    per patient     Family History  Problem Relation Age of Onset   Colon cancer Father 25   Rectal cancer Neg Hx    Stomach cancer Neg Hx    Esophageal cancer Neg Hx      Current Outpatient Medications:    brimonidine -timolol  (COMBIGAN ) 0.2-0.5 % ophthalmic solution, Place 1 drop into the right eye every 12 (twelve) hours., Disp: 10 mL, Rfl: 0   brinzolamide  (AZOPT ) 1 % ophthalmic suspension, Place 1 drop into the right eye 3 (three) times daily., Disp: 4.47 mL, Rfl: 0   meloxicam  (MOBIC ) 15 MG tablet, Take 1 tablet (15 mg total) by mouth daily., Disp: 90 tablet, Rfl: 1   Prednisolon-Gatiflox-Bromfenac  1-0.5-0.075 % SOLN, Apply 1 drop to eye every 6 (six) hours. To Right eye, Disp: 30 mL,  Rfl: 0   predniSONE  (STERAPRED UNI-PAK 21 TAB) 5 MG (21) TBPK tablet, Take as directed, Disp: 21 tablet, Rfl: 0   acetaZOLAMIDE  ER (DIAMOX ) 500 MG capsule, Take 1 capsule (500 mg total) by mouth 2 (two) times daily., Disp: 14 capsule, Rfl: 0   HYDROcodone -acetaminophen  (NORCO/VICODIN) 5-325 MG tablet, Take 1 tablet by mouth every 6 (six) hours as needed for moderate pain (pain score 4-6)., Disp: 14 tablet, Rfl: 0   No Known Allergies   Men's preventive visit. Patient Health Questionnaire (PHQ-2) is  Flowsheet Row Office Visit from 05/24/2024 in [facility]  PHQ-2 Total Score 0  Patient is on a Regular diet. Exercise - he has a labor type job. Marital status: Single. Relevant history for alcohol use is:  Social History   Substance and Sexual Activity  Alcohol Use Yes   Alcohol/week: 5.0 standard drinks of alcohol   Types: 5 Cans of beer per week   Comment: occasionally    Relevant history for tobacco use is:  Social History   Tobacco Use  Smoking Status Never  Smokeless Tobacco Never  .   Review of Systems  Constitutional: Negative.   HENT: Negative.    Eyes: Negative.        History right eye cataract  Respiratory: Negative.    Cardiovascular: Negative.   Gastrointestinal: Negative.   Endocrine: Negative.   Genitourinary: Negative.   Musculoskeletal:  Positive for arthralgias (bilateral hand pain and bilateral foot pain).  Skin: Negative.   Allergic/Immunologic: Negative.   Hematological: Negative.   Psychiatric/Behavioral: Negative.       Today's Vitals   05/24/24 1535  BP: 130/80  Pulse: 85  Temp: 98.5 F (36.9 C)  TempSrc: Oral  Weight: 218 lb 9.6 oz (99.2 kg)  Height: 6' 2 (1.88 m)  PainSc: 6   PainLoc: Generalized   Body mass index is 28.07 kg/m.  Wt Readings from Last 3 Encounters:  05/24/24 218 lb 9.6 oz (99.2 kg)  01/18/24 216 lb 14.9 oz (98.4 kg)  10/28/23 217 lb (98.4 kg)    Objective:  Physical Exam Vitals and nursing note reviewed.  Constitutional:      General: He is not in acute distress.    Appearance: Normal appearance.  HENT:     Head: Normocephalic and atraumatic.     Right Ear: Tympanic membrane, ear canal and external ear normal. There is no impacted cerumen.     Left Ear: Tympanic membrane, ear canal and external ear normal. There is no impacted cerumen.     Nose: Nose normal.     Mouth/Throat:     Mouth: Mucous membranes are moist.  Eyes:     Comments: Right eye with cataract present  Cardiovascular:     Rate and Rhythm: Normal rate and regular  rhythm.     Pulses: Normal pulses.     Heart sounds: Normal heart sounds. No murmur heard. Pulmonary:     Effort: Pulmonary effort is normal. No respiratory distress.     Breath sounds: Normal breath sounds.  Abdominal:     General: Abdomen is flat. Bowel sounds are normal. There is no distension.     Palpations: Abdomen is soft.  Genitourinary:    Prostate: Normal.     Rectum: Guaiac result negative.  Musculoskeletal:        General: Swelling and tenderness present. No signs of injury. Normal range of motion.     Cervical back: Normal range of motion and  neck supple.  Skin:    General: Skin is warm.     Capillary Refill: Capillary refill takes less than 2 seconds.  Neurological:     General: No focal deficit present.     Mental Status: He is alert and oriented to person, place, and time.     Cranial Nerves: No cranial nerve deficit.     Motor: No weakness.  Psychiatric:        Mood and Affect: Mood normal.        Behavior: Behavior normal.        Thought Content: Thought content normal.        Judgment: Judgment normal.     Assessment And Plan:    Encounter for annual health examination Assessment & Plan: Annual physical examination to address preventive health needs. Physically active due to labor-intensive job. Follows a regular diet, though cholesterol was slightly elevated previously. Declined flu vaccination. - Encourage dietary modifications to include more fiber-rich foods to help manage cholesterol levels. - Discuss benefits of flu and COVID vaccinations, though he declined.   Cataract of right eye, unspecified cataract type  Primary osteoarthritis of both hands Assessment & Plan: Chronic joint pain in hands, feet, and lumbar spine with previous lumbar spine injury. Current medications include acetaminophen  and meloxicam , but seeks additional relief. - Prescribe prednisone  taper: 6 tablets on day 1, decreasing by one tablet each day. - Discuss potential benefits  of vitamin D  supplementation for bone health. - Recommend natural supplements such as turmeric and glucosamine for inflammation.  Joint pain and hand pain suggest possible autoimmune disorder. Previous rheumatology consultation with incomplete follow-up. Dissatisfied with prior rheumatologist and seeks second opinion. - Order autoimmune panel to rule out rheumatoid arthritis and lupus. - Refer to a different rheumatologist within [facility] for a second opinion. I have requested to him to please follow through with the referral and to f/u after being seen with them as well  Orders: -     CMP14+EGFR -     predniSONE ; Take as directed  Dispense: 21 tablet; Refill: 0 -     VITAMIN D  25 Hydroxy (Vit-D Deficiency, Fractures) -     Ambulatory referral to Rheumatology  COVID-19 vaccination declined  Influenza vaccination declined  Herpes zoster vaccination declined  Bilateral foot pain -     Uric acid -     Ambulatory referral to Rheumatology  Encounter for prostate cancer screening -     PSA  Encounter for hepatitis C screening test for low risk patient -     Hepatitis C antibody  Mixed hyperlipidemia Assessment & Plan: Previously noted slightly elevated cholesterol levels. Consumes a regular diet but acknowledges challenges with dietary adherence. - Encourage consumption of fiber-rich foods such as oatmeal, Cheerios, Raisin Bran, and granola to help manage cholesterol levels.  Orders: -     Lipid panel  Other long term (current) drug therapy -     CBC with Differential/Platelet    Return for 1 year physical; 6 month cholesterol chekn. Patient was given opportunity to ask questions. Patient verbalized understanding of the plan and was able to repeat key elements of the plan. All questions were answered to their satisfaction.   Gaines Ada, FNP  I, Gaines Ada, FNP, have reviewed all documentation for this visit. The documentation on 05/24/24 for the exam, diagnosis,  procedures, and orders are all accurate and complete.

## 2024-05-24 NOTE — Patient Instructions (Signed)
 Health Maintenance  Topic Date Due   HIV Screening  Never done   Hepatitis C Screening  Never done   COVID-19 Vaccine (1) 06/09/2024*   Zoster (Shingles) Vaccine (1 of 2) 08/23/2024*   Flu Shot  12/07/2024*   Pneumococcal Vaccine for age over 58 (1 of 1 - PCV) 05/24/2025*   DTaP/Tdap/Td vaccine (2 - Td or Tdap) 06/15/2028   Colon Cancer Screening  10/27/2028   Hepatitis B Vaccine  Aged Out   HPV Vaccine  Aged Out   Meningitis B Vaccine  Aged Out  *Topic was postponed. The date shown is not the original due date.

## 2024-05-25 LAB — CBC WITH DIFFERENTIAL/PLATELET
Basophils Absolute: 0.1 x10E3/uL (ref 0.0–0.2)
Basos: 1 %
EOS (ABSOLUTE): 0.2 x10E3/uL (ref 0.0–0.4)
Eos: 2 %
Hematocrit: 44.7 % (ref 37.5–51.0)
Hemoglobin: 14.7 g/dL (ref 13.0–17.7)
Immature Grans (Abs): 0 x10E3/uL (ref 0.0–0.1)
Immature Granulocytes: 0 %
Lymphocytes Absolute: 2.1 x10E3/uL (ref 0.7–3.1)
Lymphs: 25 %
MCH: 31.2 pg (ref 26.6–33.0)
MCHC: 32.9 g/dL (ref 31.5–35.7)
MCV: 95 fL (ref 79–97)
Monocytes Absolute: 0.7 x10E3/uL (ref 0.1–0.9)
Monocytes: 9 %
Neutrophils Absolute: 5.2 x10E3/uL (ref 1.4–7.0)
Neutrophils: 63 %
Platelets: 246 x10E3/uL (ref 150–450)
RBC: 4.71 x10E6/uL (ref 4.14–5.80)
RDW: 12.4 % (ref 11.6–15.4)
WBC: 8.3 x10E3/uL (ref 3.4–10.8)

## 2024-05-25 LAB — CMP14+EGFR
ALT: 27 IU/L (ref 0–44)
AST: 21 IU/L (ref 0–40)
Albumin: 4.4 g/dL (ref 3.8–4.9)
Alkaline Phosphatase: 96 IU/L (ref 47–123)
BUN/Creatinine Ratio: 15 (ref 10–24)
BUN: 15 mg/dL (ref 8–27)
Bilirubin Total: 0.9 mg/dL (ref 0.0–1.2)
CO2: 24 mmol/L (ref 20–29)
Calcium: 9.2 mg/dL (ref 8.6–10.2)
Chloride: 106 mmol/L (ref 96–106)
Creatinine, Ser: 1.03 mg/dL (ref 0.76–1.27)
Globulin, Total: 2.9 g/dL (ref 1.5–4.5)
Glucose: 79 mg/dL (ref 70–99)
Potassium: 4 mmol/L (ref 3.5–5.2)
Sodium: 143 mmol/L (ref 134–144)
Total Protein: 7.3 g/dL (ref 6.0–8.5)
eGFR: 83 mL/min/1.73 (ref >59)

## 2024-05-25 LAB — VITAMIN D 25 HYDROXY (VIT D DEFICIENCY, FRACTURES): Vit D, 25-Hydroxy: 29.9 ng/mL — ABNORMAL LOW (ref 30.0–100.0)

## 2024-05-25 LAB — LIPID PANEL
Chol/HDL Ratio: 4.1 ratio (ref 0.0–5.0)
Cholesterol, Total: 202 mg/dL — ABNORMAL HIGH (ref 100–199)
HDL: 49 mg/dL (ref >39)
LDL Chol Calc (NIH): 128 mg/dL — ABNORMAL HIGH (ref 0–99)
Triglycerides: 143 mg/dL (ref 0–149)
VLDL Cholesterol Cal: 25 mg/dL (ref 5–40)

## 2024-05-25 LAB — HEPATITIS C ANTIBODY: Hep C Virus Ab: NONREACTIVE

## 2024-05-25 LAB — URIC ACID: Uric Acid: 5.8 mg/dL (ref 3.8–8.4)

## 2024-05-25 LAB — PSA: Prostate Specific Ag, Serum: 1.1 ng/mL (ref 0.0–4.0)

## 2024-05-30 ENCOUNTER — Ambulatory Visit: Payer: Self-pay | Admitting: Nurse Practitioner

## 2024-05-30 DIAGNOSIS — M19041 Primary osteoarthritis, right hand: Secondary | ICD-10-CM | POA: Insufficient documentation

## 2024-05-30 DIAGNOSIS — E782 Mixed hyperlipidemia: Secondary | ICD-10-CM | POA: Insufficient documentation

## 2024-05-30 MED ORDER — VITAMIN D (ERGOCALCIFEROL) 1.25 MG (50000 UNIT) PO CAPS: 50000.0000 [IU] | ORAL_CAPSULE | ORAL | 1 refills | Status: AC

## 2024-05-30 NOTE — Assessment & Plan Note (Addendum)
 Chronic joint pain in hands, feet, and lumbar spine with previous lumbar spine injury. Current medications include acetaminophen  and meloxicam , but seeks additional relief. - Prescribe prednisone  taper: 6 tablets on day 1, decreasing by one tablet each day. - Discuss potential benefits of vitamin D  supplementation for bone health. - Recommend natural supplements such as turmeric and glucosamine for inflammation.  Joint pain and hand pain suggest possible autoimmune disorder. Previous rheumatology consultation with incomplete follow-up. Dissatisfied with prior rheumatologist and seeks second opinion. - Order autoimmune panel to rule out rheumatoid arthritis and lupus. - Refer to a different rheumatologist within [facility] for a second opinion. I have requested to him to please follow through with the referral and to f/u after being seen with them as well

## 2024-05-30 NOTE — Assessment & Plan Note (Signed)
 Annual physical examination to address preventive health needs. Physically active due to labor-intensive job. Follows a regular diet, though cholesterol was slightly elevated previously. Declined flu vaccination. - Encourage dietary modifications to include more fiber-rich foods to help manage cholesterol levels. - Discuss benefits of flu and COVID vaccinations, though he declined.

## 2024-05-30 NOTE — Assessment & Plan Note (Signed)
 Previously noted slightly elevated cholesterol levels. Consumes a regular diet but acknowledges challenges with dietary adherence. - Encourage consumption of fiber-rich foods such as oatmeal, Cheerios, Raisin Bran, and granola to help manage cholesterol levels.

## 2024-06-04 DIAGNOSIS — Z961 Presence of intraocular lens: Secondary | ICD-10-CM | POA: Diagnosis not present

## 2024-06-04 DIAGNOSIS — H401113 Primary open-angle glaucoma, right eye, severe stage: Secondary | ICD-10-CM | POA: Diagnosis not present

## 2024-08-03 ENCOUNTER — Ambulatory Visit
Admission: RE | Admit: 2024-08-03 | Discharge: 2024-08-03 | Disposition: A | Discharge status: Home / Self Care | Source: Ambulatory Visit

## 2024-08-03 ENCOUNTER — Encounter: Payer: Self-pay | Admitting: *Deleted

## 2024-08-03 ENCOUNTER — Ambulatory Visit

## 2024-08-03 VITALS — BP 131/85 | HR 70 | Temp 98.1°F | Resp 13 | Ht 73.25 in | Wt 214.6 lb

## 2024-08-03 DIAGNOSIS — M19042 Primary osteoarthritis, left hand: Secondary | ICD-10-CM | POA: Diagnosis not present

## 2024-08-03 DIAGNOSIS — M19041 Primary osteoarthritis, right hand: Secondary | ICD-10-CM | POA: Diagnosis not present

## 2024-08-03 DIAGNOSIS — Z1159 Encounter for screening for other viral diseases: Secondary | ICD-10-CM

## 2024-08-03 DIAGNOSIS — Z79899 Other long term (current) drug therapy: Secondary | ICD-10-CM

## 2024-08-03 DIAGNOSIS — Z111 Encounter for screening for respiratory tuberculosis: Secondary | ICD-10-CM

## 2024-08-03 DIAGNOSIS — M79671 Pain in right foot: Secondary | ICD-10-CM | POA: Diagnosis not present

## 2024-08-03 DIAGNOSIS — M79672 Pain in left foot: Secondary | ICD-10-CM | POA: Diagnosis not present

## 2024-08-03 DIAGNOSIS — M1990 Unspecified osteoarthritis, unspecified site: Secondary | ICD-10-CM

## 2024-08-03 DIAGNOSIS — M1380 Other specified arthritis, unspecified site: Secondary | ICD-10-CM

## 2024-08-03 MED ORDER — PREDNISONE 10 MG PO TABS: ORAL_TABLET | ORAL | 0 refills | Status: AC

## 2024-08-03 MED ORDER — VITAMIN D3 50 MCG (2000 UT) PO CAPS: 2000.0000 [IU] | ORAL_CAPSULE | Freq: Every day | ORAL | 0 refills | Status: AC

## 2024-08-03 NOTE — Progress Notes (Signed)
 Office Visit Note  Patient: Darryl Hull             Date of Birth: 1966-06-11           MRN: [id number]             PCP: Georgina Speaks, FNP Referring: Georgina Speaks, FNP Visit Date: 08/03/2024 Occupation: Data Unavailable  Subjective:  New Patient (Initial Visit) (Joint Pain and swelling. History of a back injury. )   Discussed the use of AI scribe software for clinical note transcription with the patient, who gave verbal consent to proceed.  History of Present Illness Darryl Hull is a 58 year old male who presents with joint pain for evaluation of arthritis. He is accompanied by his daughter and nurse, learning disability. He was referred by his primary care physician for evaluation of arthritis.  He has been experiencing persistent joint pain primarily in his hands and feet for approximately two years, accompanied by morning stiffness that improves with movement and warm showers. Cold weather and physical activity exacerbate the pain, impacting daily activities. He has tried prednisone  in the past, which provided only short-term relief. He denies relief with ibuprofen  or meloxicam .  He associates the onset of his joint pain with a lumbar injury that occurred around August 2022, which was declared permanent and limits him to lifting no more than 35 pounds. He recalls that his doctor explained the lumbar injury could affect areas from his back down to his feet, and he uses a TENS machine to manage muscle tension related to this injury.  No significant swelling in his joints, but he notes discomfort and pain when curling his fingers, particularly in the ring finger. He reports cracking sounds in his knees when squatting. He has not had any injuries to his hands or feet but has been exposed to cold temperatures extensively due to his work history, which he believes may have contributed to his symptoms.  His family history includes a grandmother who suffered from chronic arthritis. He is unsure about  other family members having arthritis but mentions that some have bone pain. No history of psoriasis or gout in his family.  He recalls having COVID-19 in September 2020 but does not associate it directly with the onset of his joint pain. His joint pain began around the same time as his lumbar injury in 2022.     Activities of Daily Living:  Patient reports morning stiffness for 10-15 minutes.   Patient Reports nocturnal pain.  Difficulty dressing/grooming: Denies Difficulty climbing stairs: Denies Difficulty getting out of chair: Denies Difficulty using hands for taps, buttons, cutlery, and/or writing: Reports  Review of Systems  Constitutional:  Positive for fatigue.  HENT:  Negative for mouth sores and mouth dryness.   Eyes:  Negative for dryness.  Respiratory:  Negative for shortness of breath.   Cardiovascular:  Negative for chest pain and palpitations.  Gastrointestinal:  Negative for blood in stool, constipation and diarrhea.  Endocrine: Negative for increased urination.  Genitourinary:  Negative for involuntary urination.  Musculoskeletal:  Positive for joint pain, joint pain, joint swelling and morning stiffness. Negative for gait problem, myalgias, muscle weakness, muscle tenderness and myalgias.  Skin:  Negative for color change, rash, hair loss and sensitivity to sunlight.  Allergic/Immunologic: Negative for susceptible to infections.  Neurological:  Negative for dizziness and headaches.  Hematological:  Negative for swollen glands.  Psychiatric/Behavioral:  Positive for depressed mood. Negative for sleep disturbance. The patient is not nervous/anxious.  PMFS History:  Patient Active Problem List   Diagnosis Date Noted   Primary osteoarthritis of both hands 05/30/2024   Mixed hyperlipidemia 05/30/2024   Encounter for annual health examination 05/21/2023   Herpes zoster vaccination declined 05/21/2023   Influenza vaccination declined 05/21/2023   Bilateral foot  pain 05/21/2023   Joint pain in both hands 01/23/2023   Establishing care with new doctor, encounter for 01/23/2023   Cataract of right eye 01/23/2023    Past Medical History:  Diagnosis Date   Sleep apnea    per patient    Family History  Problem Relation Age of Onset   Hypertension Mother    Colon cancer Father 46   Hypertension Brother    Rectal cancer Neg Hx    Stomach cancer Neg Hx    Esophageal cancer Neg Hx    Past Surgical History:  Procedure Laterality Date   CATARACT EXTRACTION Right    Social History   Tobacco Use   Smoking status: Never    Passive exposure: Past   Smokeless tobacco: Never  Vaping Use   Vaping status: Never Used  Substance Use Topics   Alcohol use: Yes    Alcohol/week: 2.0 - 3.0 standard drinks of alcohol    Types: 2 - 3 Cans of beer per week   Drug use: No   Social History   Social History Narrative   ** Merged History Encounter **         Immunization History  Administered Date(s) Administered   Tdap 06/15/2018     Objective: Vital Signs: BP 131/85 (Cuff Size: Large)   Pulse 70   Temp 98.1 F (36.7 C)   Resp 13   Ht 6' 1.25 (1.861 m)   Wt 214 lb 9.6 oz (97.3 kg)   BMI 28.12 kg/m    Physical Exam Vitals and nursing note reviewed.  HENT:     Head: Normocephalic and atraumatic.     Nose: Nose normal.  Eyes:     Conjunctiva/sclera: Conjunctivae normal.     Pupils: Pupils are equal, round, and reactive to light.  Pulmonary:     Effort: Pulmonary effort is normal. No respiratory distress.  Musculoskeletal:     Comments: Synovial thickening present b/l  Skin:    General: Skin is warm and dry.  Neurological:     Mental Status: He is alert. Mental status is at baseline.  Psychiatric:        Mood and Affect: Mood normal.        Behavior: Behavior normal.      Musculoskeletal Exam:   CDAI Exam: CDAI Score: -- Patient Global: --; Provider Global: -- Swollen: 1 ; Tender: 1  Joint Exam 08/03/2024      Right   Left  MTP 1     Swollen Tender     Investigation: No additional findings.  Imaging: No results found.  Recent Labs: Lab Results  Component Value Date   WBC 8.3 05/24/2024   HGB 14.7 05/24/2024   PLT 246 05/24/2024   NA 143 05/24/2024   K 4.0 05/24/2024   CL 106 05/24/2024   CO2 24 05/24/2024   GLUCOSE 79 05/24/2024   BUN 15 05/24/2024   CREATININE 1.03 05/24/2024   BILITOT 0.9 05/24/2024   ALKPHOS 96 05/24/2024   AST 21 05/24/2024   ALT 27 05/24/2024   PROT 7.3 05/24/2024   ALBUMIN 4.4 05/24/2024   CALCIUM 9.2 05/24/2024   GFRAA >60 02/09/2017   XR L Hand (  03/07/23): I personally reviewed the images, and based on my interpretation there is significant joint space narrowing of PIP's and DIP's bilaterally. Questionable moderate erosion on DIP of L index finger (unable to visualize due to mark-ups).    Speciality Comments: No specialty comments available.  Procedures:  No procedures performed Allergies: Patient has no known allergies.   Assessment / Plan:     Visit Diagnoses:  Inflammatory Arthritis Osteoarthritis Patient with joint pain that appears multifactorial in nature. He has evidence of OA and erosive changes concerning for inflammatory arthritis on prior XR's from 2024. His symptoms are not classic for an inflammatory arthritis, however given prior imaging, cannot rule out an inflammatory process (likely psoriatic given DIP erosions and asymmetric nature). Will obtain DG Hand 2 View Right, DG Hand 2 View Left, DG Foot 2 Views Left, DG Foot 2 Views Right, Sedimentation rate, C-reactive protein, Rheumatoid factor, Cyclic citrul peptide antibody, IgG.  Discussed possibly initiating Methotrexate. Handout provided to patient to review in detail. Patient provided with 25 day prednisone  taper to make it through the holiday. Discussed steroid side effects including weight gain, decreased bone density/fractures, risk for infection, avascular necrosis, hypertension,  hyperglycemia, dyspepsia/gastric ulcers, fluid retention, weakness, bruising, acne, cataracts, insomnia, mood problems. Patient also instructed to take this medication with food, and not to take this medication with NSAIDs such as Aleve  or Ibuprofen . Patient verbalized understanding.  Vitamin D  Insufficiency Patient with mildly low vitamin D  level. He is s/p weekly supplementation. Patient provided with 90 day supply of 2000IU daily supplement.  High risk medication use Patient starting prednisone  and needing to start DMARD therapy for inflammatory arthritis. Baseline toxicity labs (CBC, Cr, AST, ALT) obtained today. Will also obtain hepatitis and quantiferon TB today.  Orders: Orders Placed This Encounter  Procedures   DG Hand 2 View Right   DG Hand 2 View Left   DG Foot 2 Views Left   DG Foot 2 Views Right   CBC   AST   ALT   Creatinine, serum   Hepatitis B core antibody, IgM   Hepatitis B surface antigen   Hepatitis C antibody   QuantiFERON-TB Gold Plus   Sedimentation rate   C-reactive protein   Rheumatoid factor   Cyclic citrul peptide antibody, IgG   Meds ordered this encounter  Medications   predniSONE  (DELTASONE ) 10 MG tablet    Sig: Take 2.5 tablets (25 mg total) by mouth daily with breakfast for 5 days, THEN 2 tablets (20 mg total) daily with breakfast for 5 days, THEN 1.5 tablets (15 mg total) daily with breakfast for 5 days, THEN 1 tablet (10 mg total) daily with breakfast for 5 days, THEN 0.5 tablets (5 mg total) daily with breakfast for 5 days.    Dispense:  37.5 tablet    Refill:  0   Cholecalciferol (VITAMIN D3) 50 MCG (2000 UT) capsule    Sig: Take 1 capsule (2,000 Units total) by mouth daily.    Dispense:  90 capsule    Refill:  0    I personally spent a total of 60 minutes in the care of the patient today including preparing to see the patient, getting/reviewing separately obtained history, performing a medically appropriate exam/evaluation, counseling and  educating, placing orders, documenting clinical information in the EHR, and independently interpreting results.   Follow-Up Instructions: Return in about 4 weeks (around 08/31/2024).   Asberry Claw, DO  Note - This record has been created using [employer].  Chart creation errors  have been sought, but may not always  have been located. Such creation errors do not reflect on  the standard of medical care.

## 2024-08-06 LAB — HEPATITIS B CORE ANTIBODY, IGM: Hep B C IgM: NONREACTIVE

## 2024-08-06 LAB — CBC
HCT: 45.3 % (ref 39.4–51.1)
Hemoglobin: 15.1 g/dL (ref 13.2–17.1)
MCH: 30.7 pg (ref 27.0–33.0)
MCHC: 33.3 g/dL (ref 31.6–35.4)
MCV: 92.1 fL (ref 81.4–101.7)
MPV: 10.4 fL (ref 7.5–12.5)
Platelets: 249 Thousand/uL (ref 140–400)
RBC: 4.92 Million/uL (ref 4.20–5.80)
RDW: 12.2 % (ref 11.0–15.0)
WBC: 7.6 Thousand/uL (ref 3.8–10.8)

## 2024-08-06 LAB — C-REACTIVE PROTEIN: CRP: 3.2 mg/L (ref <8.0)

## 2024-08-06 LAB — HEPATITIS B SURFACE ANTIGEN: Hepatitis B Surface Ag: NONREACTIVE

## 2024-08-06 LAB — AST: AST: 21 U/L (ref 10–35)

## 2024-08-06 LAB — SEDIMENTATION RATE: Sed Rate: 9 mm/h (ref 0–20)

## 2024-08-06 LAB — QUANTIFERON-TB GOLD PLUS
Mitogen-NIL: 8.5 [IU]/mL
NIL: 0.01 [IU]/mL
QuantiFERON-TB Gold Plus: NEGATIVE
TB1-NIL: 0.08 [IU]/mL
TB2-NIL: 0.07 [IU]/mL

## 2024-08-06 LAB — RHEUMATOID FACTOR: Rheumatoid fact SerPl-aCnc: <10 [IU]/mL (ref <14)

## 2024-08-06 LAB — CYCLIC CITRUL PEPTIDE ANTIBODY, IGG: Cyclic Citrullin Peptide Ab: <16 U

## 2024-08-06 LAB — CREATININE, SERUM: Creat: 0.74 mg/dL (ref 0.70–1.35)

## 2024-08-06 LAB — HEPATITIS C ANTIBODY: Hepatitis C Ab: NONREACTIVE

## 2024-08-06 LAB — ALT: ALT: 25 U/L (ref 9–46)

## 2024-08-26 NOTE — Progress Notes (Deleted)
 Office Visit Note  Patient: Darryl Hull             Date of Birth: 1966-03-26           MRN: [id number]             PCP: Georgina Speaks, FNP Referring: Georgina Speaks, FNP Visit Date: 09/06/2024 Occupation: Data Unavailable  Subjective:  No chief complaint on file.   History of Present Illness: Darryl Hull is a 58 y.o. male ***     Activities of Daily Living:  Patient reports morning stiffness for *** {minute/hour:19697}.   Patient {ACTIONS;DENIES/REPORTS:21021675::Denies} nocturnal pain.  Difficulty dressing/grooming: {ACTIONS;DENIES/REPORTS:21021675::Denies} Difficulty climbing stairs: {ACTIONS;DENIES/REPORTS:21021675::Denies} Difficulty getting out of chair: {ACTIONS;DENIES/REPORTS:21021675::Denies} Difficulty using hands for taps, buttons, cutlery, and/or writing: {ACTIONS;DENIES/REPORTS:21021675::Denies}  No Rheumatology ROS completed.   PMFS History:  Patient Active Problem List   Diagnosis Date Noted   Primary osteoarthritis of both hands 05/30/2024   Mixed hyperlipidemia 05/30/2024   Encounter for annual health examination 05/21/2023   Herpes zoster vaccination declined 05/21/2023   Influenza vaccination declined 05/21/2023   Bilateral foot pain 05/21/2023   Joint pain in both hands 01/23/2023   Establishing care with new doctor, encounter for 01/23/2023   Cataract of right eye 01/23/2023    Past Medical History:  Diagnosis Date   Sleep apnea    per patient    Family History  Problem Relation Age of Onset   Hypertension Mother    Colon cancer Father 61   Hypertension Brother    Rectal cancer Neg Hx    Stomach cancer Neg Hx    Esophageal cancer Neg Hx    Past Surgical History:  Procedure Laterality Date   CATARACT EXTRACTION Right    Social History[1] Social History   Social History Narrative   ** Merged History Encounter **         Immunization History  Administered Date(s) Administered   Tdap 06/15/2018      Objective: Vital Signs: There were no vitals taken for this visit.   Physical Exam   Musculoskeletal Exam: ***  CDAI Exam: CDAI Score: -- Patient Global: --; Provider Global: -- Swollen: --; Tender: -- Joint Exam 09/06/2024   No joint exam has been documented for this visit   There is currently no information documented on the homunculus. Go to the Rheumatology activity and complete the homunculus joint exam.  Investigation: No additional findings.  Imaging: DG Hand 2 View Right Result Date: 08/03/2024 CLINICAL DATA:  polyarthralgia EXAM: RIGHT HAND - 2 VIEW COMPARISON:  March 07, 2023 FINDINGS: No acute fracture or dislocation. Moderate joint space loss of the second DIP joint. Mild- joint space loss of the third through fifth DIP joints. No bony erosions. Soft tissues are unremarkable. No radiopaque foreign body. IMPRESSION: 1. No acute fracture or dislocation. 2. Similar mild-to-moderate osteoarthritis of the second through fifth digits, as delineated above. Electronically Signed   By: Rogelia Myers M.D.   On: 08/03/2024 18:00   DG Foot 2 Views Right Result Date: 08/03/2024 CLINICAL DATA:  polyarthralgia EXAM: RIGHT FOOT - 2 VIEW COMPARISON:  None Available. FINDINGS: No acute fracture or dislocation. There is no evidence of arthropathy or other focal bone abnormality. Small undersurface calcaneal heel spur. No bony erosions. Soft tissues are unremarkable. No radiopaque foreign body. IMPRESSION: No acute fracture or dislocation. Electronically Signed   By: Rogelia Myers M.D.   On: 08/03/2024 17:56   DG Foot 2 Views Left Result Date: 08/03/2024 CLINICAL DATA:  polyarthralgia  EXAM: DG FOOT 2V*L* COMPARISON:  None Available. FINDINGS: No acute fracture or dislocation. There is no evidence of arthropathy or other focal bone abnormality. No bony erosions. Soft tissues are unremarkable. No radiopaque foreign body. IMPRESSION: No acute fracture or dislocation. Electronically Signed    By: Rogelia Myers M.D.   On: 08/03/2024 17:55   DG Hand 2 View Left Result Date: 08/03/2024 CLINICAL DATA:  polyarthralgia EXAM: LEFT HAND - 2 VIEW COMPARISON:  None Available. FINDINGS: No acute fracture or dislocation. Mild joint space loss of the second DIP joint. No bony erosions visualized. Soft tissues are unremarkable. No radiopaque foreign body. IMPRESSION: 1. No acute fracture or dislocation. 2. Mild osteoarthritis of the second digit. Electronically Signed   By: Rogelia Myers M.D.   On: 08/03/2024 17:54    Recent Labs: Lab Results  Component Value Date   WBC 7.6 08/03/2024   HGB 15.1 08/03/2024   PLT 249 08/03/2024   NA 143 05/24/2024   K 4.0 05/24/2024   CL 106 05/24/2024   CO2 24 05/24/2024   GLUCOSE 79 05/24/2024   BUN 15 05/24/2024   CREATININE 0.74 08/03/2024   BILITOT 0.9 05/24/2024   ALKPHOS 96 05/24/2024   AST 21 08/03/2024   ALT 25 08/03/2024   PROT 7.3 05/24/2024   ALBUMIN 4.4 05/24/2024   CALCIUM 9.2 05/24/2024   GFRAA >60 02/09/2017   QFTBGOLDPLUS NEGATIVE 08/03/2024    Speciality Comments: No specialty comments available.  Procedures:  No procedures performed Allergies: Patient has no known allergies.   Assessment / Plan:     Visit Diagnoses: No diagnosis found.  Orders: No orders of the defined types were placed in this encounter.  No orders of the defined types were placed in this encounter.   Face-to-face time spent with patient was *** minutes. Greater than 50% of time was spent in counseling and coordination of care.  Follow-Up Instructions: No follow-ups on file.   Alfonso Patterson, LPN  Note - This record has been created using [employer].  Chart creation errors have been sought, but may not always  have been located. Such creation errors do not reflect on  the standard of medical care.    [1]  Social History Tobacco Use   Smoking status: Never    Passive exposure: Past   Smokeless tobacco: Never  Vaping Use    Vaping status: Never Used  Substance Use Topics   Alcohol use: Yes    Alcohol/week: 2.0 - 3.0 standard drinks of alcohol    Types: 2 - 3 Cans of beer per week   Drug use: No

## 2024-08-31 ENCOUNTER — Ambulatory Visit: Payer: Self-pay

## 2024-09-06 ENCOUNTER — Ambulatory Visit

## 2024-09-21 NOTE — Progress Notes (Unsigned)
 "  Office Visit Note  Patient: Darryl Hull             Date of Birth: 1965-12-01           MRN: [id number]             PCP: Georgina Speaks, FNP Referring: Georgina Speaks, FNP Visit Date: 09/27/2024 Occupation: Data Unavailable  Subjective:  No chief complaint on file.   History of Present Illness: Blayden Conwell is a 59 y.o. male with joint pain who presents for a new patient follow up. Patient was last seen on 11/25/2025where he was started on Prednisone  and the need for a DMARD was discussed.      Activities of Daily Living:  Patient reports morning stiffness for *** {minute/hour:19697}.   Patient {ACTIONS;DENIES/REPORTS:21021675::Denies} nocturnal pain.  Difficulty dressing/grooming: {ACTIONS;DENIES/REPORTS:21021675::Denies} Difficulty climbing stairs: {ACTIONS;DENIES/REPORTS:21021675::Denies} Difficulty getting out of chair: {ACTIONS;DENIES/REPORTS:21021675::Denies} Difficulty using hands for taps, buttons, cutlery, and/or writing: {ACTIONS;DENIES/REPORTS:21021675::Denies}  No Rheumatology ROS completed.   PMFS History:  Patient Active Problem List   Diagnosis Date Noted   Primary osteoarthritis of both hands 05/30/2024   Mixed hyperlipidemia 05/30/2024   Encounter for annual health examination 05/21/2023   Herpes zoster vaccination declined 05/21/2023   Influenza vaccination declined 05/21/2023   Bilateral foot pain 05/21/2023   Joint pain in both hands 01/23/2023   Establishing care with new doctor, encounter for 01/23/2023   Cataract of right eye 01/23/2023    Past Medical History:  Diagnosis Date   Sleep apnea    per patient    Family History  Problem Relation Age of Onset   Hypertension Mother    Colon cancer Father 78   Hypertension Brother    Rectal cancer Neg Hx    Stomach cancer Neg Hx    Esophageal cancer Neg Hx    Past Surgical History:  Procedure Laterality Date   CATARACT EXTRACTION Right    Social History[1] Social History    Social History Narrative   ** Merged History Encounter **         Immunization History  Administered Date(s) Administered   Tdap 06/15/2018     Objective: Vital Signs: There were no vitals taken for this visit.   Physical Exam   Musculoskeletal Exam: ***  CDAI Exam: CDAI Score: -- Patient Global: --; Provider Global: -- Swollen: --; Tender: -- Joint Exam 09/27/2024   No joint exam has been documented for this visit   There is currently no information documented on the homunculus. Go to the Rheumatology activity and complete the homunculus joint exam.  Investigation: No additional findings.  Imaging: No results found.  Recent Labs: Lab Results  Component Value Date   WBC 7.6 08/03/2024   HGB 15.1 08/03/2024   PLT 249 08/03/2024   NA 143 05/24/2024   K 4.0 05/24/2024   CL 106 05/24/2024   CO2 24 05/24/2024   GLUCOSE 79 05/24/2024   BUN 15 05/24/2024   CREATININE 0.74 08/03/2024   BILITOT 0.9 05/24/2024   ALKPHOS 96 05/24/2024   AST 21 08/03/2024   ALT 25 08/03/2024   PROT 7.3 05/24/2024   ALBUMIN 4.4 05/24/2024   CALCIUM 9.2 05/24/2024   GFRAA >60 02/09/2017   QFTBGOLDPLUS NEGATIVE 08/03/2024    Speciality Comments: No specialty comments available.  Procedures:  No procedures performed Allergies: Patient has no known allergies.   Assessment / Plan:     Visit Diagnoses: No diagnosis found.  Orders: No orders of the defined types were placed in this  encounter.  No orders of the defined types were placed in this encounter.   Face-to-face time spent with patient was *** minutes. Greater than 50% of time was spent in counseling and coordination of care.  Follow-Up Instructions: No follow-ups on file.   Alfonso Patterson, LPN  Note - This record has been created using [employer].  Chart creation errors have been sought, but may not always  have been located. Such creation errors do not reflect on  the standard of medical care.      [1]  Social History Tobacco Use   Smoking status: Never    Passive exposure: Past   Smokeless tobacco: Never  Vaping Use   Vaping status: Never Used  Substance Use Topics   Alcohol use: Yes    Alcohol/week: 2.0 - 3.0 standard drinks of alcohol    Types: 2 - 3 Cans of beer per week   Drug use: No   "

## 2024-09-27 ENCOUNTER — Ambulatory Visit: Payer: Self-pay

## 2024-09-27 DIAGNOSIS — M1990 Unspecified osteoarthritis, unspecified site: Secondary | ICD-10-CM

## 2024-09-27 DIAGNOSIS — M1380 Other specified arthritis, unspecified site: Secondary | ICD-10-CM

## 2024-09-27 DIAGNOSIS — Z79899 Other long term (current) drug therapy: Secondary | ICD-10-CM

## 2024-09-27 NOTE — Progress Notes (Unsigned)
 "  Office Visit Note  Patient: Darryl Hull             Date of Birth: February 21, 1966           MRN: [id number]             PCP: Georgina Speaks, FNP Referring: Georgina Speaks, FNP Visit Date: 10/06/2024 Occupation: Data Unavailable  Subjective:  No chief complaint on file.   History of Present Illness: Darryl Hull is a 59 y.o. male with joint pain who presents for a new patient follow up. Patient was last seen on 11/25/2025where he was started on Prednisone  and the need for a DMARD was discussed.      Activities of Daily Living:  Patient reports morning stiffness for *** {minute/hour:19697}.   Patient {ACTIONS;DENIES/REPORTS:21021675::Denies} nocturnal pain.  Difficulty dressing/grooming: {ACTIONS;DENIES/REPORTS:21021675::Denies} Difficulty climbing stairs: {ACTIONS;DENIES/REPORTS:21021675::Denies} Difficulty getting out of chair: {ACTIONS;DENIES/REPORTS:21021675::Denies} Difficulty using hands for taps, buttons, cutlery, and/or writing: {ACTIONS;DENIES/REPORTS:21021675::Denies}  No Rheumatology ROS completed.   PMFS History:  Patient Active Problem List   Diagnosis Date Noted   Primary osteoarthritis of both hands 05/30/2024   Mixed hyperlipidemia 05/30/2024   Encounter for annual health examination 05/21/2023   Herpes zoster vaccination declined 05/21/2023   Influenza vaccination declined 05/21/2023   Bilateral foot pain 05/21/2023   Joint pain in both hands 01/23/2023   Establishing care with new doctor, encounter for 01/23/2023   Cataract of right eye 01/23/2023    Past Medical History:  Diagnosis Date   Sleep apnea    per patient    Family History  Problem Relation Age of Onset   Hypertension Mother    Colon cancer Father 71   Hypertension Brother    Rectal cancer Neg Hx    Stomach cancer Neg Hx    Esophageal cancer Neg Hx    Past Surgical History:  Procedure Laterality Date   CATARACT EXTRACTION Right    Social History[1] Social History    Social History Narrative   ** Merged History Encounter **         Immunization History  Administered Date(s) Administered   Tdap 06/15/2018     Objective: Vital Signs: There were no vitals taken for this visit.   Physical Exam   Musculoskeletal Exam: ***  CDAI Exam: CDAI Score: -- Patient Global: --; Provider Global: -- Swollen: --; Tender: -- Joint Exam 10/06/2024   No joint exam has been documented for this visit   There is currently no information documented on the homunculus. Go to the Rheumatology activity and complete the homunculus joint exam.  Investigation: No additional findings.  Imaging: No results found.  Recent Labs: Lab Results  Component Value Date   WBC 7.6 08/03/2024   HGB 15.1 08/03/2024   PLT 249 08/03/2024   NA 143 05/24/2024   K 4.0 05/24/2024   CL 106 05/24/2024   CO2 24 05/24/2024   GLUCOSE 79 05/24/2024   BUN 15 05/24/2024   CREATININE 0.74 08/03/2024   BILITOT 0.9 05/24/2024   ALKPHOS 96 05/24/2024   AST 21 08/03/2024   ALT 25 08/03/2024   PROT 7.3 05/24/2024   ALBUMIN 4.4 05/24/2024   CALCIUM 9.2 05/24/2024   GFRAA >60 02/09/2017   QFTBGOLDPLUS NEGATIVE 08/03/2024    Speciality Comments: No specialty comments available.  Procedures:  No procedures performed Allergies: Patient has no known allergies.   Assessment / Plan:     Visit Diagnoses: No diagnosis found.  Orders: No orders of the defined types were placed in this  encounter.  No orders of the defined types were placed in this encounter.   Face-to-face time spent with patient was *** minutes. Greater than 50% of time was spent in counseling and coordination of care.  Follow-Up Instructions: No follow-ups on file.   Alfonso Patterson, LPN  Note - This record has been created using [employer].  Chart creation errors have been sought, but may not always  have been located. Such creation errors do not reflect on  the standard of medical care.       [1]  Social History Tobacco Use   Smoking status: Never    Passive exposure: Past   Smokeless tobacco: Never  Vaping Use   Vaping status: Never Used  Substance Use Topics   Alcohol use: Yes    Alcohol/week: 2.0 - 3.0 standard drinks of alcohol    Types: 2 - 3 Cans of beer per week   Drug use: No   "

## 2024-10-06 ENCOUNTER — Ambulatory Visit: Payer: Self-pay

## 2024-10-06 DIAGNOSIS — M1380 Other specified arthritis, unspecified site: Secondary | ICD-10-CM

## 2024-10-06 DIAGNOSIS — Z79899 Other long term (current) drug therapy: Secondary | ICD-10-CM

## 2024-11-23 ENCOUNTER — Ambulatory Visit: Payer: Self-pay | Admitting: Nurse Practitioner

## 2025-05-25 ENCOUNTER — Encounter: Payer: Self-pay | Admitting: Nurse Practitioner
